# Patient Record
Sex: Female | Born: 1960 | Race: White | Hispanic: No | Marital: Married | Smoking: Never smoker
Health system: Southern US, Community
[De-identification: ages and names within clinical notes are randomized; demographics above are authoritative.]

## PROBLEM LIST (undated history)

## (undated) HISTORY — PX: TUBAL LIGATION: SHX77

---

## 1998-10-14 ENCOUNTER — Emergency Department (HOSPITAL_COMMUNITY): Admission: EM | Admit: 1998-10-14 | Discharge: 1998-10-14 | Payer: Self-pay | Admitting: Emergency Medicine

## 2000-03-28 ENCOUNTER — Other Ambulatory Visit: Admission: RE | Admit: 2000-03-28 | Discharge: 2000-03-28 | Payer: Self-pay | Admitting: Obstetrics and Gynecology

## 2001-04-25 ENCOUNTER — Other Ambulatory Visit: Admission: RE | Admit: 2001-04-25 | Discharge: 2001-04-25 | Payer: Self-pay | Admitting: Obstetrics and Gynecology

## 2002-05-27 ENCOUNTER — Other Ambulatory Visit: Admission: RE | Admit: 2002-05-27 | Discharge: 2002-05-27 | Payer: Self-pay | Admitting: Obstetrics and Gynecology

## 2003-08-04 ENCOUNTER — Other Ambulatory Visit: Admission: RE | Admit: 2003-08-04 | Discharge: 2003-08-04 | Payer: Self-pay | Admitting: Obstetrics and Gynecology

## 2004-08-10 ENCOUNTER — Other Ambulatory Visit: Admission: RE | Admit: 2004-08-10 | Discharge: 2004-08-10 | Payer: Self-pay | Admitting: Obstetrics and Gynecology

## 2005-09-02 ENCOUNTER — Other Ambulatory Visit: Admission: RE | Admit: 2005-09-02 | Discharge: 2005-09-02 | Payer: Self-pay | Admitting: Obstetrics and Gynecology

## 2012-12-18 ENCOUNTER — Other Ambulatory Visit: Payer: Self-pay | Admitting: Obstetrics and Gynecology

## 2012-12-18 DIAGNOSIS — R928 Other abnormal and inconclusive findings on diagnostic imaging of breast: Secondary | ICD-10-CM

## 2012-12-26 ENCOUNTER — Ambulatory Visit
Admission: RE | Admit: 2012-12-26 | Discharge: 2012-12-26 | Disposition: A | Discharge status: Home / Self Care | Payer: BC Managed Care – PPO | Source: Ambulatory Visit | Attending: Obstetrics and Gynecology | Admitting: Obstetrics and Gynecology

## 2012-12-26 DIAGNOSIS — R928 Other abnormal and inconclusive findings on diagnostic imaging of breast: Secondary | ICD-10-CM

## 2012-12-26 IMAGING — US US BREAST*R*
1 series · 4 of 4 positions shown · non-contrast
Comparison: [DATE] [DATE], [DATE], [DATE] [DATE], [DATE]

CLINICAL DATA: Called back from screening mammogram for possible
mass right breast

DIGITAL DIAGNOSTIC RIGHT MAMMOGRAM [DATE] AND RIGHT
BREAST ULTRASOUND:

[Series 1: us breast*right* · 4 of 4 slices shown]
[im 1/4]
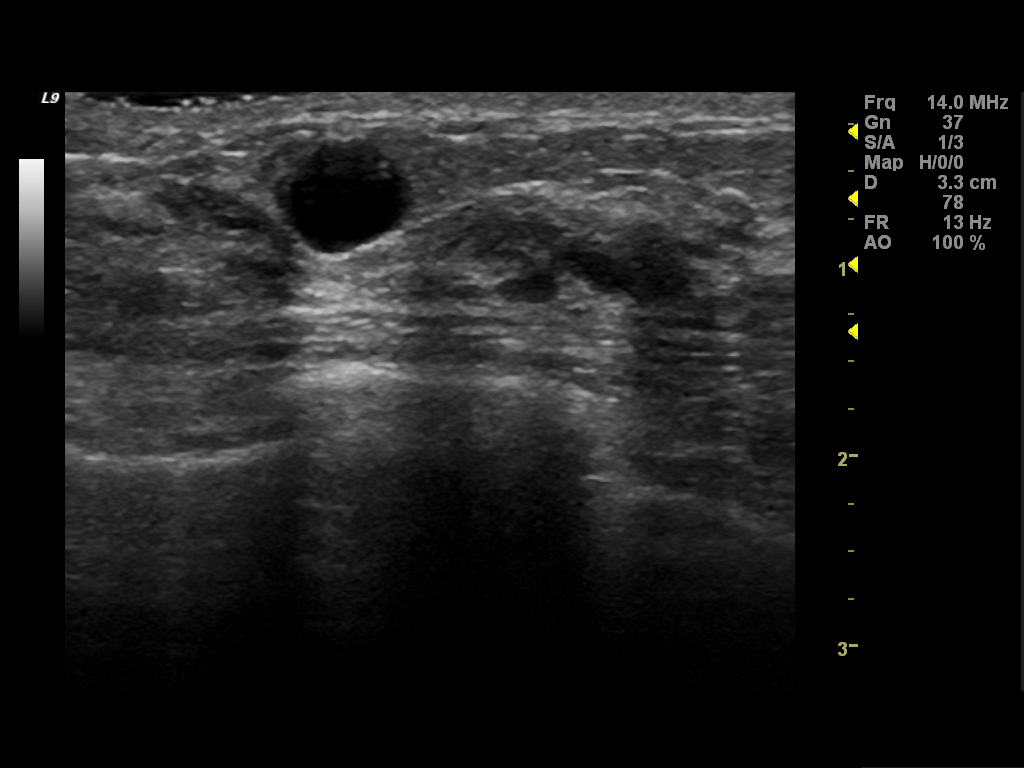
[im 2/4]
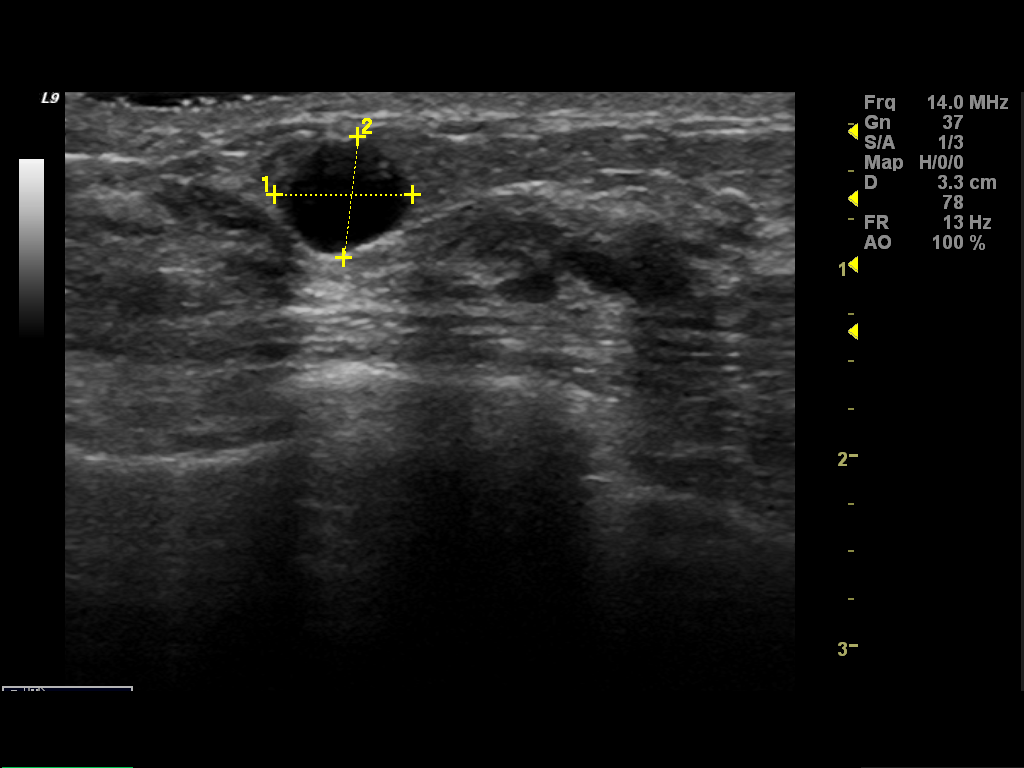
[im 3/4]
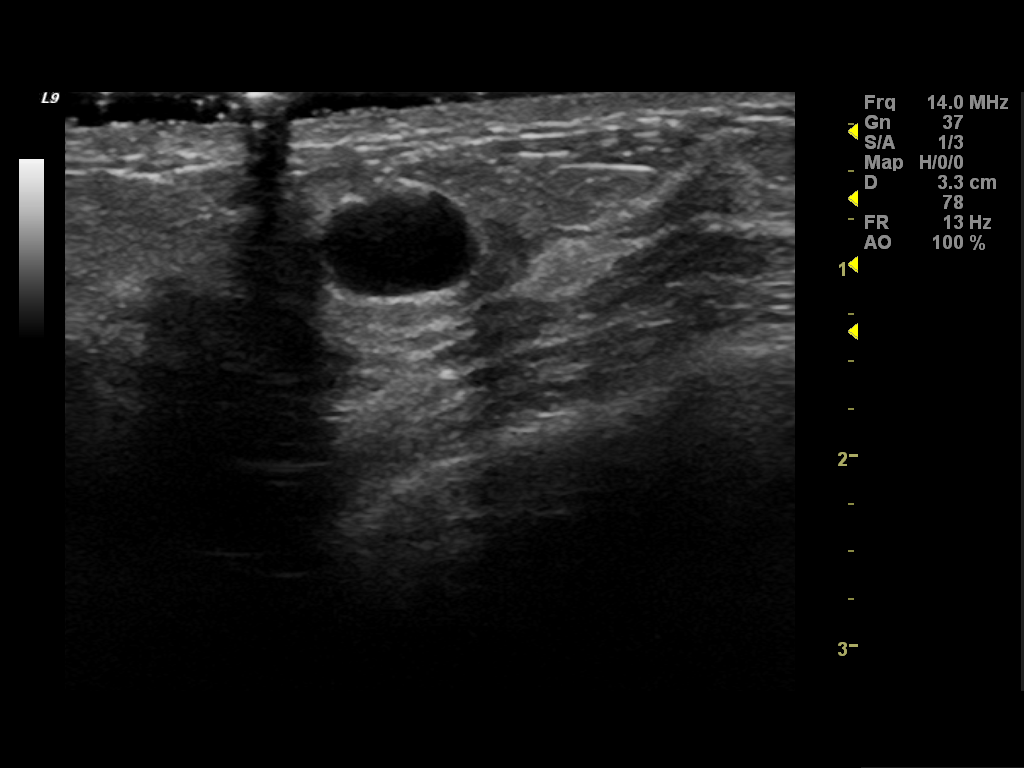
[im 4/4]
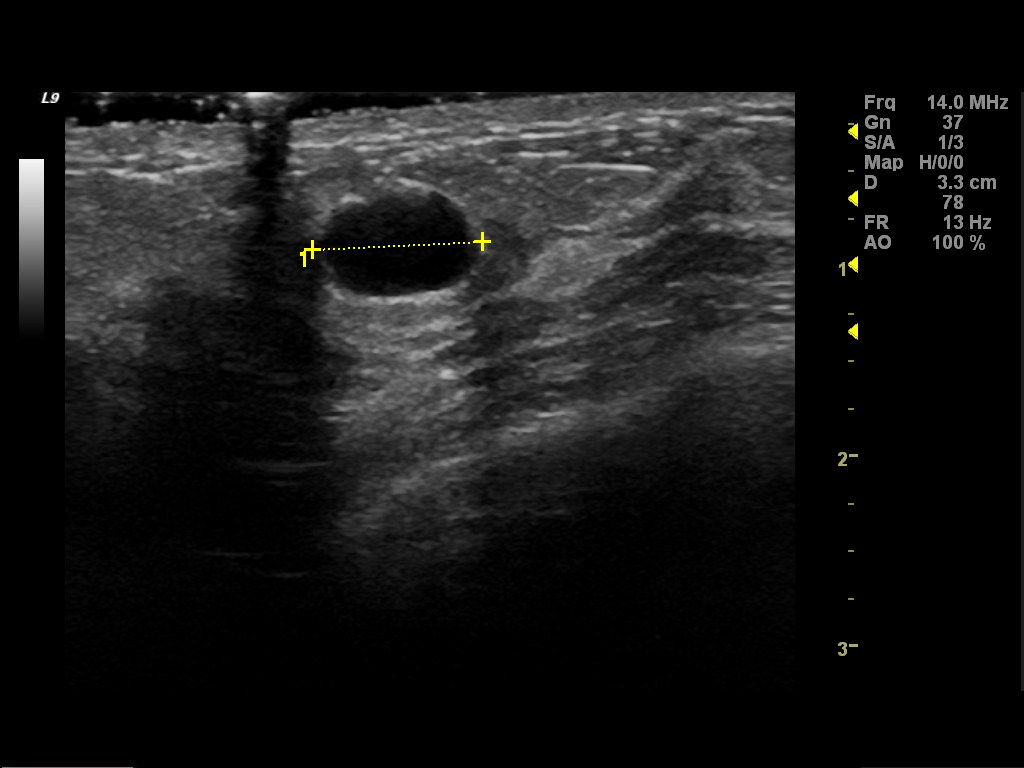

[4 of 4 positions shown; findings below may reference images not displayed]

FINDINGS: ACR Breast Density Category extremely dense

Spot compression CC and MLO views of the right breast are
submitted.  There is a focal asymmetry at the right breast nine
o'clock position.

Ultrasound is performed, showing 0.73 x 0.64 x 0.89 cm simple cyst
at the right breast nine o'clock position 1.5 cm from nipple
correlating to the mammographic finding.
IMPRESSION: Benign findings.

RECOMMENDATION:
Routine screening mammogram back on schedule.

I have discussed the findings and recommendations with the patient.
Results were also provided in writing at the conclusion of the
visit.

BI-RADS CATEGORY 2:  Benign finding(s).

## 2012-12-26 IMAGING — MG MM DIGITAL DIAGNOSTIC UNILAT*R*
2 series · 2 of 2 positions shown · non-contrast
Comparison: [DATE] [DATE], [DATE], [DATE] [DATE], [DATE]

CLINICAL DATA: Called back from screening mammogram for possible
mass right breast

DIGITAL DIAGNOSTIC RIGHT MAMMOGRAM [DATE] AND RIGHT
BREAST ULTRASOUND:

[R CC]
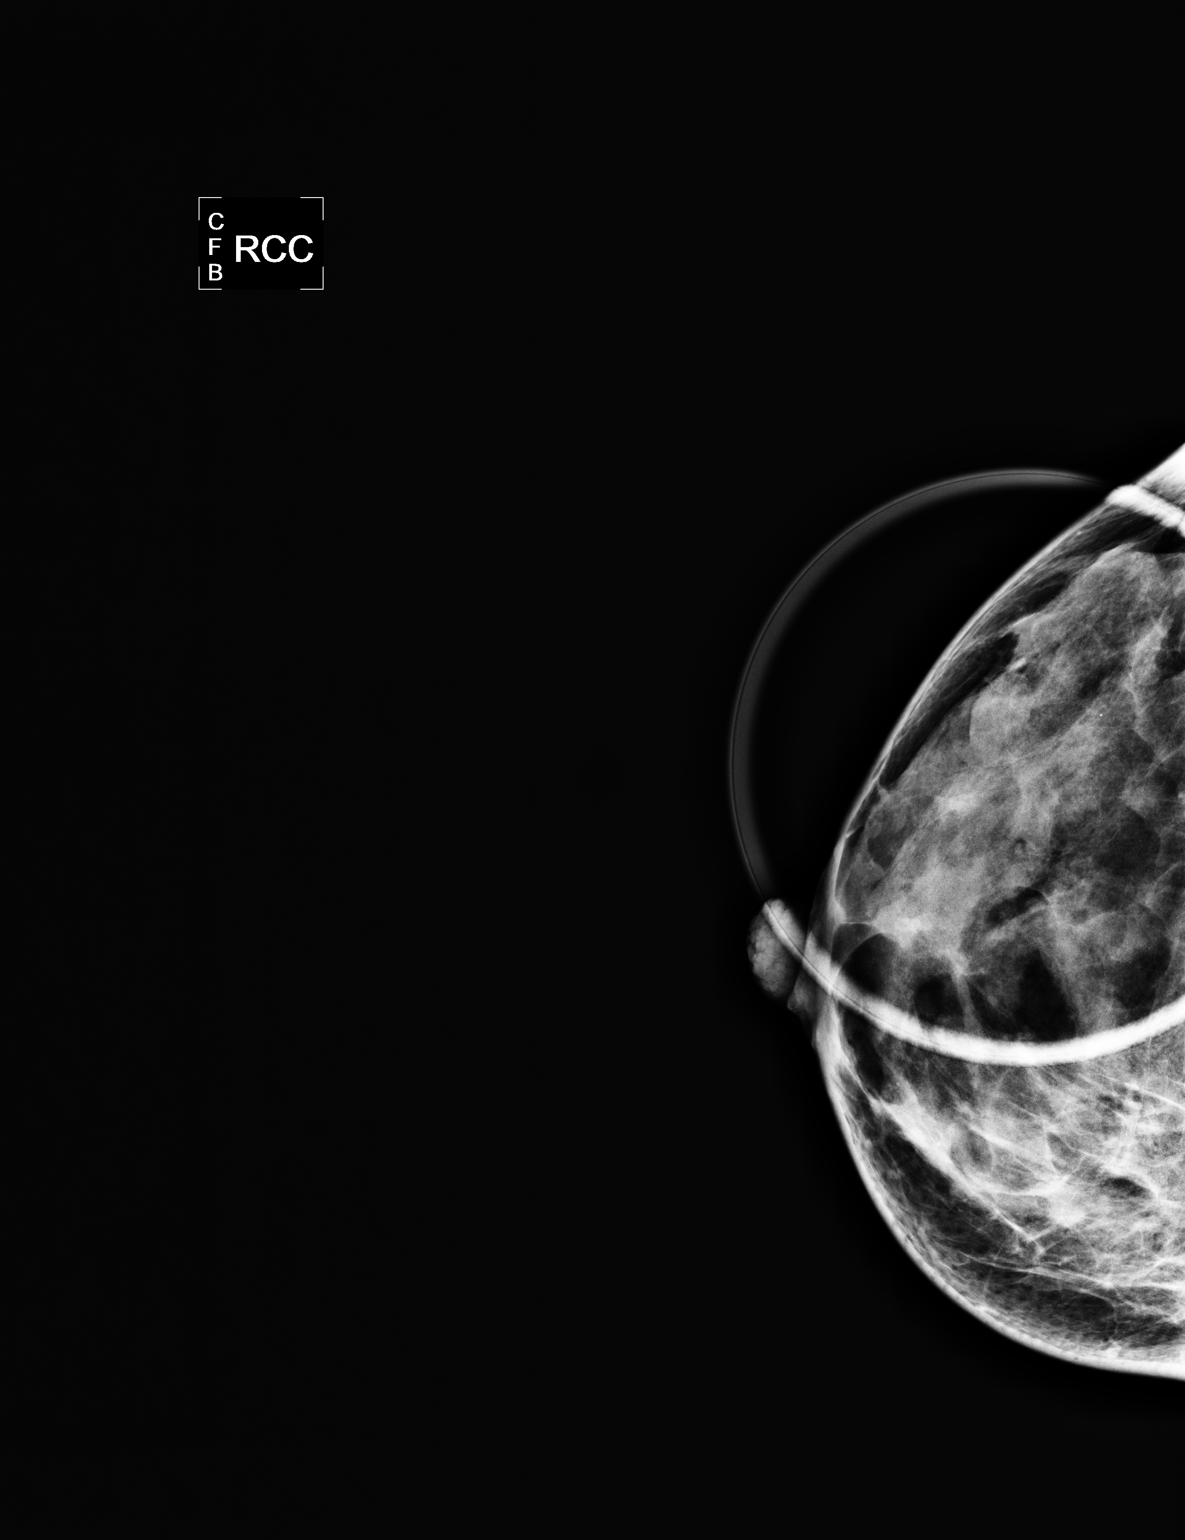

[R MLO]
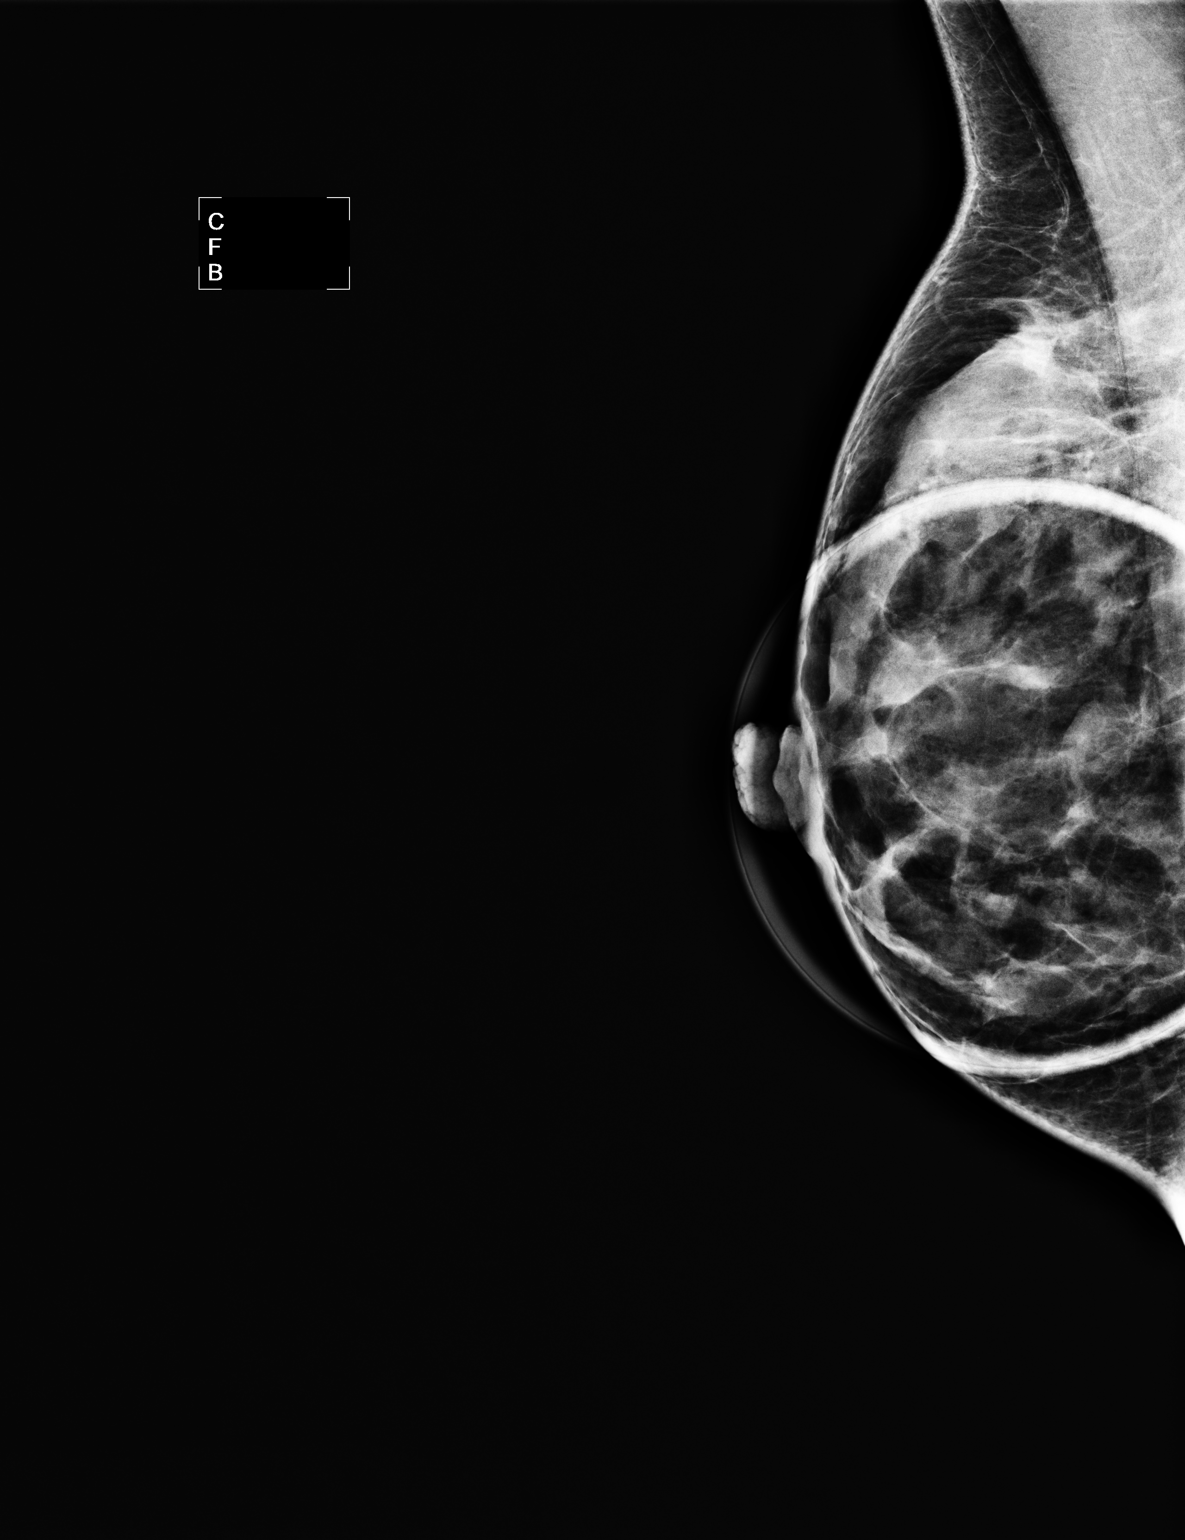

[2 of 2 positions shown; findings below may reference images not displayed]

FINDINGS: ACR Breast Density Category extremely dense

Spot compression CC and MLO views of the right breast are
submitted.  There is a focal asymmetry at the right breast nine
o'clock position.

Ultrasound is performed, showing 0.73 x 0.64 x 0.89 cm simple cyst
at the right breast nine o'clock position 1.5 cm from nipple
correlating to the mammographic finding.
IMPRESSION: Benign findings.

RECOMMENDATION:
Routine screening mammogram back on schedule.

I have discussed the findings and recommendations with the patient.
Results were also provided in writing at the conclusion of the
visit.

BI-RADS CATEGORY 2:  Benign finding(s).

## 2013-12-19 ENCOUNTER — Other Ambulatory Visit: Payer: Self-pay | Admitting: Obstetrics and Gynecology

## 2013-12-19 DIAGNOSIS — R928 Other abnormal and inconclusive findings on diagnostic imaging of breast: Secondary | ICD-10-CM

## 2013-12-24 ENCOUNTER — Ambulatory Visit
Admission: RE | Admit: 2013-12-24 | Discharge: 2013-12-24 | Disposition: A | Discharge status: Home / Self Care | Payer: BC Managed Care – PPO | Source: Ambulatory Visit | Attending: Obstetrics and Gynecology | Admitting: Obstetrics and Gynecology

## 2013-12-24 DIAGNOSIS — R928 Other abnormal and inconclusive findings on diagnostic imaging of breast: Secondary | ICD-10-CM

## 2013-12-24 IMAGING — MG MM DIANOSTIC UNILATERAL R
2 series · 2 of 2 positions shown · non-contrast
Comparison: [DATE], [DATE]

CLINICAL DATA: Patient returns for evaluation of a possible mass in
the left breast noted on recent screening study dated [DATE]
from [REDACTED].

EXAM:
DIGITAL DIAGNOSTIC  RIGHT MAMMOGRAM
ULTRASOUND RIGHT BREAST

[R MLO]
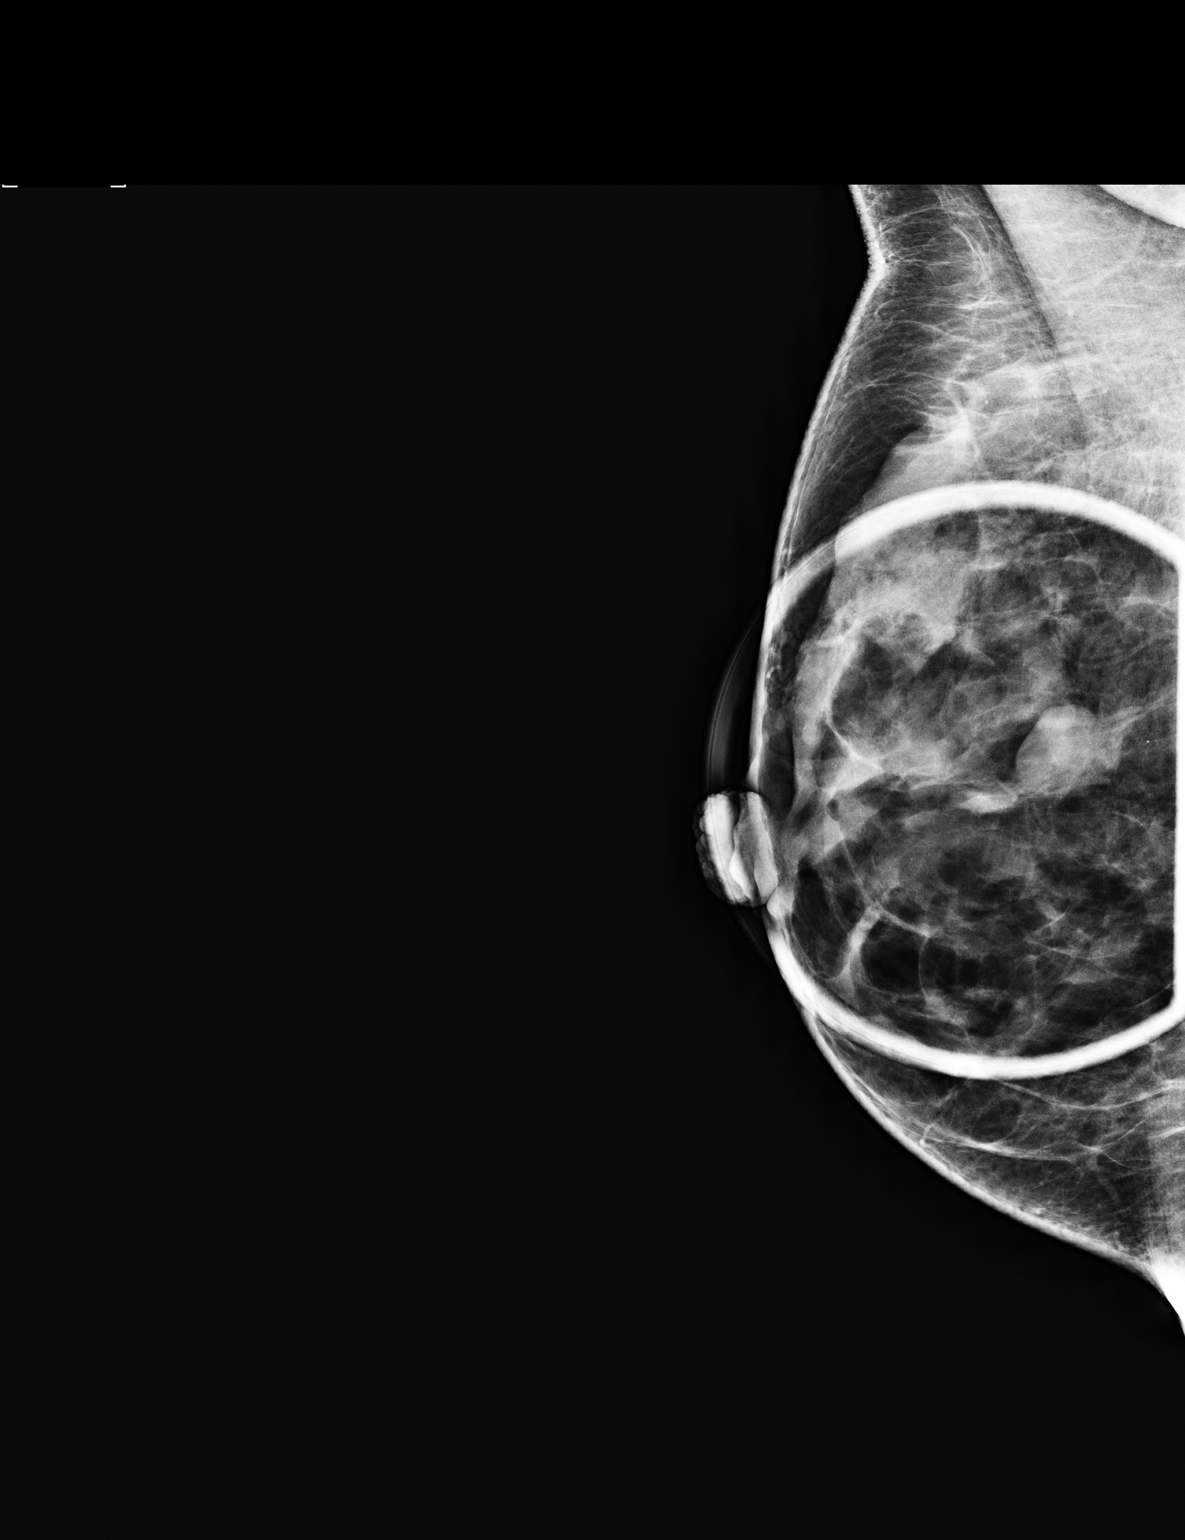

[R CC]
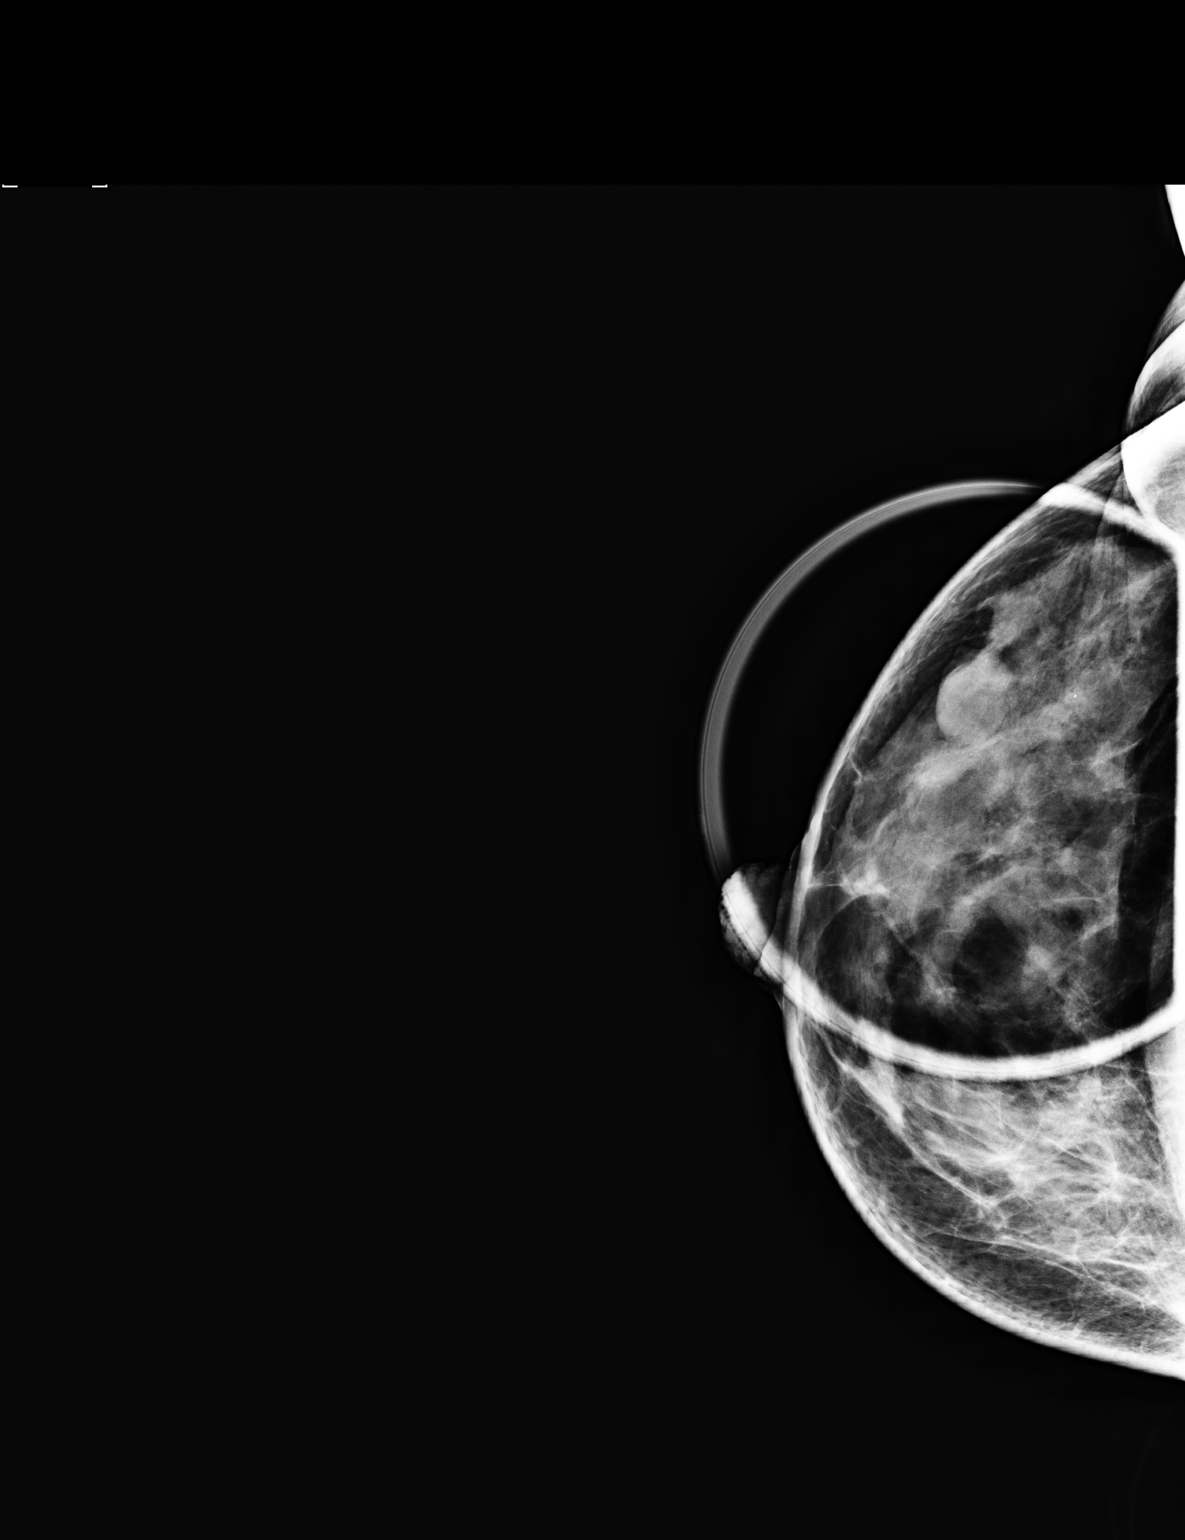

[2 of 2 positions shown; findings below may reference images not displayed]

ACR Breast Density Category c: The breast tissue is heterogeneously
dense, which may obscure small masses.
FINDINGS: Additional views confirm the presence of a predominantly
circumscribed oval mass in the mid outer portion of the right
breast.

On physical exam, no mass is palpated on physical exam

Ultrasound is performed, showing multiple cysts are present in the 9
and 9:30 o'clock position of the right breast. The largest is at 9
o'clock 4 cm from the right nipple, corresponding to the
mammographic nodule, and measuring 1.3 x 0.7 x 1.0 cm. No solid mass
or distortion is noted.
IMPRESSION: Right breast cysts.

RECOMMENDATION:
Yearly screening mammography is suggested.

I have discussed the findings and recommendations with the patient.
Results were also provided in writing at the conclusion of the
visit. If applicable, a reminder letter will be sent to the patient
regarding the next appointment.

BI-RADS CATEGORY  2: Benign Finding(s)

## 2013-12-24 IMAGING — US US BREAST*R* LIMITED INC AXILLA
1 series · 12 of 12 positions shown · non-contrast
Comparison: [DATE], [DATE]

CLINICAL DATA: Patient returns for evaluation of a possible mass in
the left breast noted on recent screening study dated [DATE]
from [REDACTED].

EXAM:
DIGITAL DIAGNOSTIC  RIGHT MAMMOGRAM
ULTRASOUND RIGHT BREAST

[Series 1: us breast*right* limited inc axilla · 12 of 12 slices shown]
[im 1/12]
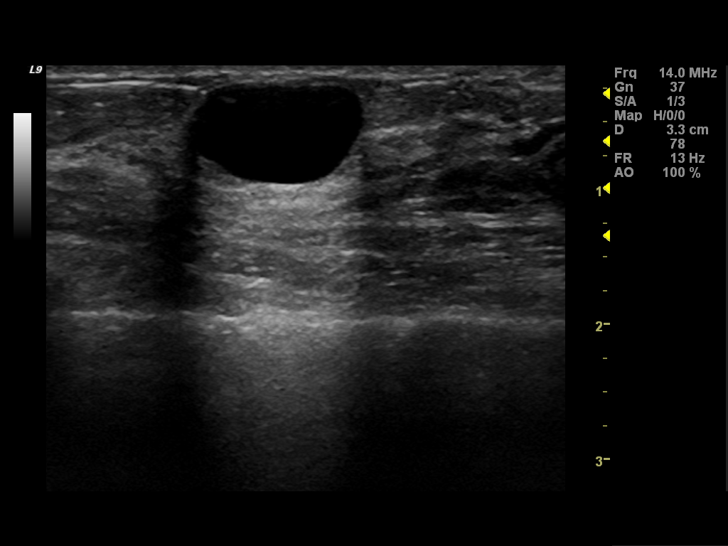
[im 2/12]
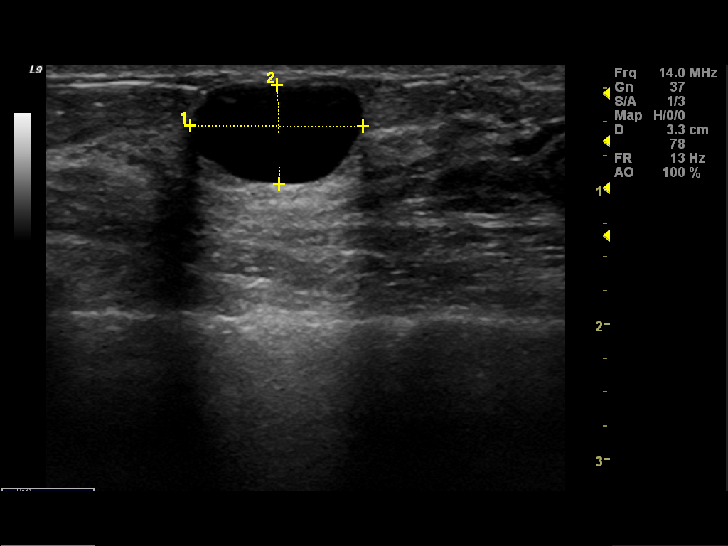
[im 3/12]
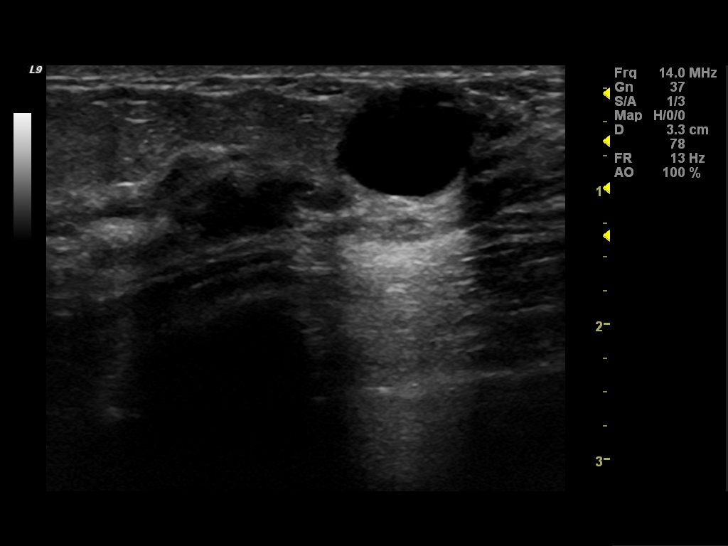
[im 4/12]
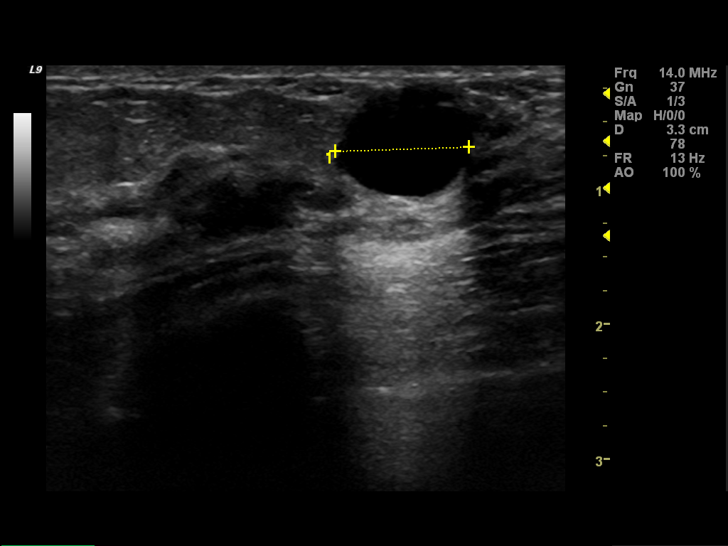
[im 5/12]
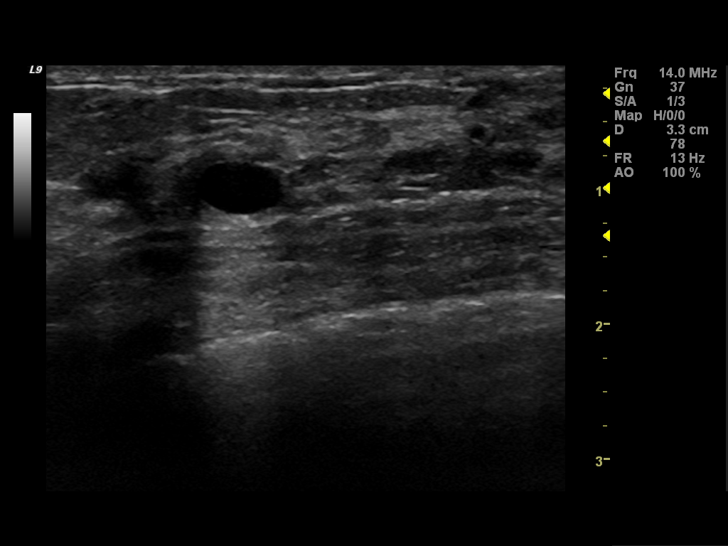
[im 6/12]
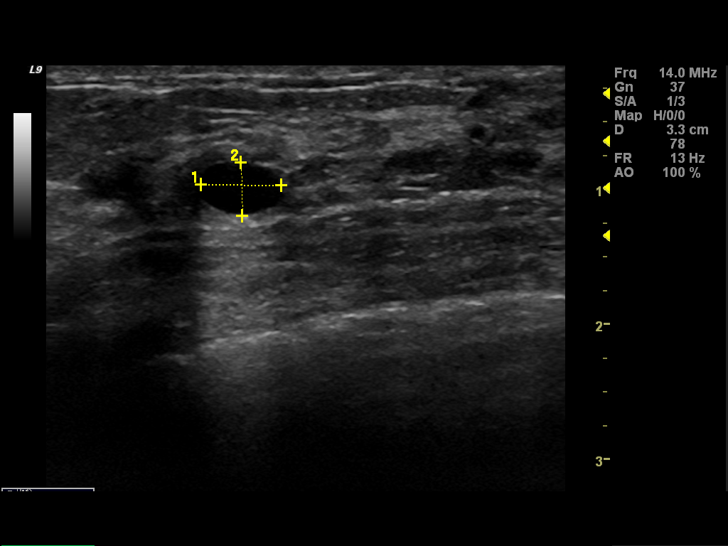
[im 7/12]
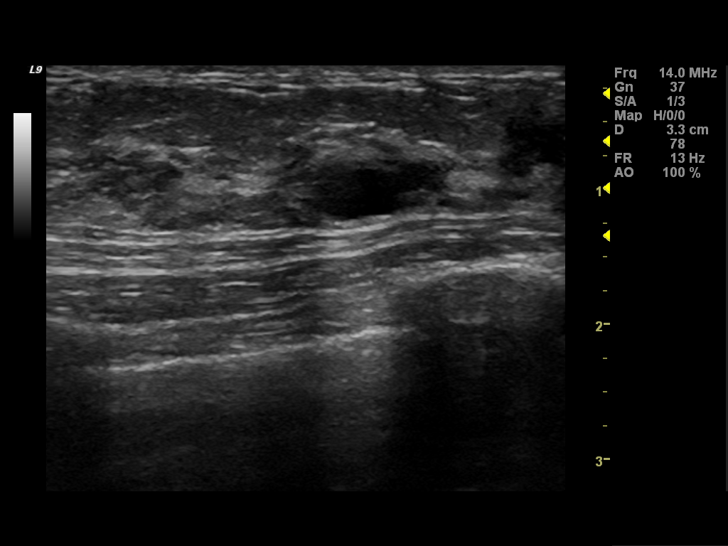
[im 8/12]
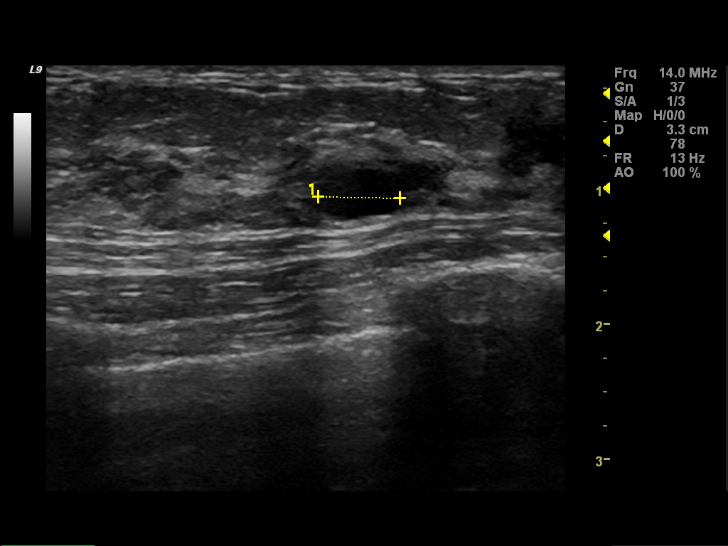
[im 9/12]
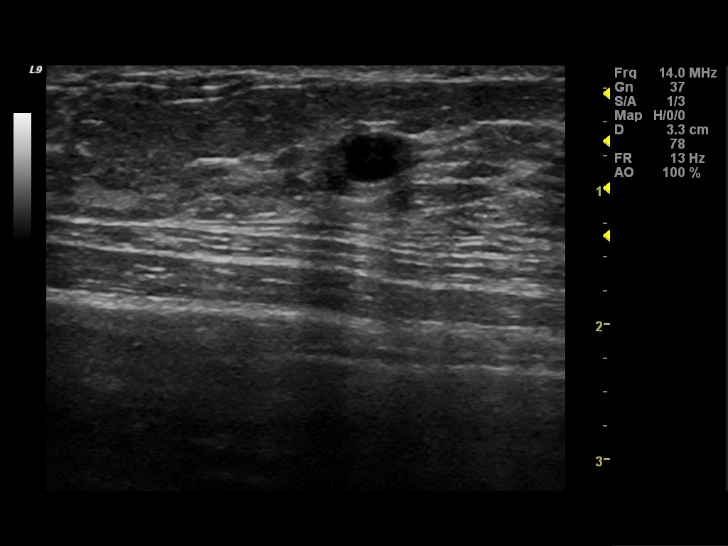
[im 10/12]
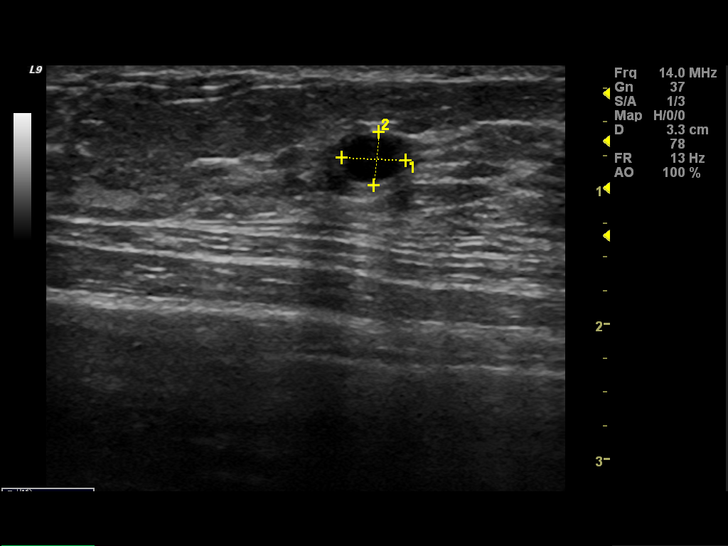
[im 11/12]
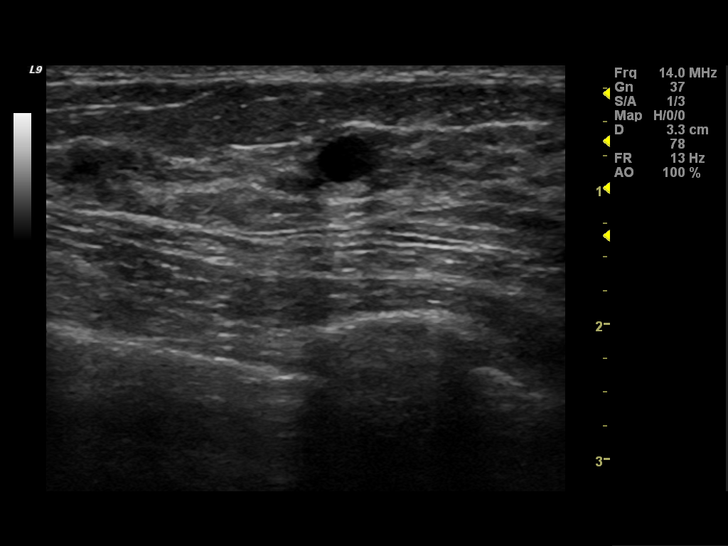
[im 12/12]
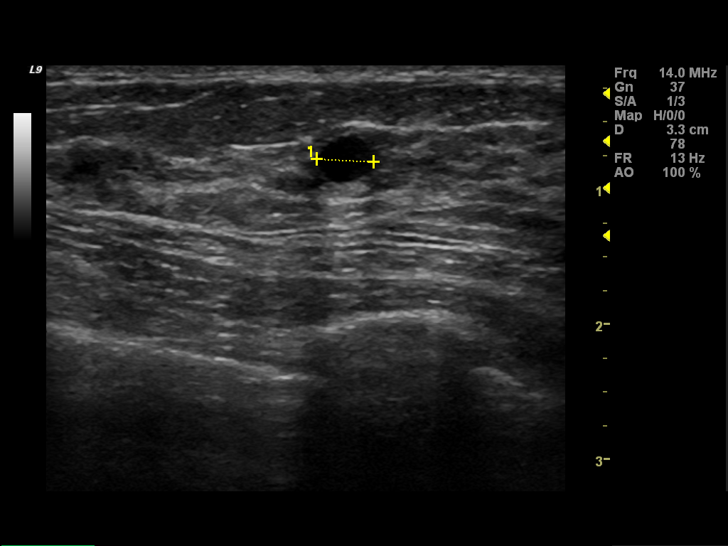

[12 of 12 positions shown; findings below may reference images not displayed]

ACR Breast Density Category c: The breast tissue is heterogeneously
dense, which may obscure small masses.
FINDINGS: Additional views confirm the presence of a predominantly
circumscribed oval mass in the mid outer portion of the right
breast.

On physical exam, no mass is palpated on physical exam

Ultrasound is performed, showing multiple cysts are present in the 9
and 9:30 o'clock position of the right breast. The largest is at 9
o'clock 4 cm from the right nipple, corresponding to the
mammographic nodule, and measuring 1.3 x 0.7 x 1.0 cm. No solid mass
or distortion is noted.
IMPRESSION: Right breast cysts.

RECOMMENDATION:
Yearly screening mammography is suggested.

I have discussed the findings and recommendations with the patient.
Results were also provided in writing at the conclusion of the
visit. If applicable, a reminder letter will be sent to the patient
regarding the next appointment.

BI-RADS CATEGORY  2: Benign Finding(s)

## 2013-12-31 ENCOUNTER — Other Ambulatory Visit: Payer: Self-pay | Admitting: Obstetrics and Gynecology

## 2013-12-31 DIAGNOSIS — N6310 Unspecified lump in the right breast, unspecified quadrant: Secondary | ICD-10-CM

## 2017-04-10 DIAGNOSIS — Z01419 Encounter for gynecological examination (general) (routine) without abnormal findings: Secondary | ICD-10-CM | POA: Diagnosis not present

## 2017-04-10 DIAGNOSIS — Z1231 Encounter for screening mammogram for malignant neoplasm of breast: Secondary | ICD-10-CM | POA: Diagnosis not present

## 2017-04-10 DIAGNOSIS — Z6823 Body mass index (BMI) 23.0-23.9, adult: Secondary | ICD-10-CM | POA: Diagnosis not present

## 2017-05-16 DIAGNOSIS — L573 Poikiloderma of Civatte: Secondary | ICD-10-CM | POA: Diagnosis not present

## 2017-05-16 DIAGNOSIS — L738 Other specified follicular disorders: Secondary | ICD-10-CM | POA: Diagnosis not present

## 2017-05-16 DIAGNOSIS — L720 Epidermal cyst: Secondary | ICD-10-CM | POA: Diagnosis not present

## 2017-07-24 DIAGNOSIS — M859 Disorder of bone density and structure, unspecified: Secondary | ICD-10-CM | POA: Diagnosis not present

## 2017-07-24 DIAGNOSIS — Z0000 Encounter for general adult medical examination without abnormal findings: Secondary | ICD-10-CM | POA: Diagnosis not present

## 2017-07-24 DIAGNOSIS — E784 Other hyperlipidemia: Secondary | ICD-10-CM | POA: Diagnosis not present

## 2017-07-31 DIAGNOSIS — E559 Vitamin D deficiency, unspecified: Secondary | ICD-10-CM | POA: Diagnosis not present

## 2017-07-31 DIAGNOSIS — Z0000 Encounter for general adult medical examination without abnormal findings: Secondary | ICD-10-CM | POA: Diagnosis not present

## 2017-07-31 DIAGNOSIS — E784 Other hyperlipidemia: Secondary | ICD-10-CM | POA: Diagnosis not present

## 2017-07-31 DIAGNOSIS — M25561 Pain in right knee: Secondary | ICD-10-CM | POA: Diagnosis not present

## 2017-07-31 DIAGNOSIS — Z1389 Encounter for screening for other disorder: Secondary | ICD-10-CM | POA: Diagnosis not present

## 2017-09-27 DIAGNOSIS — H04123 Dry eye syndrome of bilateral lacrimal glands: Secondary | ICD-10-CM | POA: Diagnosis not present

## 2017-10-12 DIAGNOSIS — Z23 Encounter for immunization: Secondary | ICD-10-CM | POA: Diagnosis not present

## 2018-04-19 DIAGNOSIS — Z13820 Encounter for screening for osteoporosis: Secondary | ICD-10-CM | POA: Diagnosis not present

## 2018-04-19 DIAGNOSIS — Z1231 Encounter for screening mammogram for malignant neoplasm of breast: Secondary | ICD-10-CM | POA: Diagnosis not present

## 2018-04-19 DIAGNOSIS — Z6822 Body mass index (BMI) 22.0-22.9, adult: Secondary | ICD-10-CM | POA: Diagnosis not present

## 2018-04-19 DIAGNOSIS — Z01419 Encounter for gynecological examination (general) (routine) without abnormal findings: Secondary | ICD-10-CM | POA: Diagnosis not present

## 2018-05-28 DIAGNOSIS — R1084 Generalized abdominal pain: Secondary | ICD-10-CM | POA: Diagnosis not present

## 2018-05-28 DIAGNOSIS — R1031 Right lower quadrant pain: Secondary | ICD-10-CM | POA: Diagnosis not present

## 2018-05-28 DIAGNOSIS — M545 Low back pain: Secondary | ICD-10-CM | POA: Diagnosis not present

## 2018-05-30 DIAGNOSIS — M546 Pain in thoracic spine: Secondary | ICD-10-CM | POA: Diagnosis not present

## 2018-06-04 DIAGNOSIS — S30861A Insect bite (nonvenomous) of abdominal wall, initial encounter: Secondary | ICD-10-CM | POA: Diagnosis not present

## 2018-06-04 DIAGNOSIS — M545 Low back pain: Secondary | ICD-10-CM | POA: Diagnosis not present

## 2018-06-04 DIAGNOSIS — Z6821 Body mass index (BMI) 21.0-21.9, adult: Secondary | ICD-10-CM | POA: Diagnosis not present

## 2018-06-27 DIAGNOSIS — L821 Other seborrheic keratosis: Secondary | ICD-10-CM | POA: Diagnosis not present

## 2018-06-27 DIAGNOSIS — D2262 Melanocytic nevi of left upper limb, including shoulder: Secondary | ICD-10-CM | POA: Diagnosis not present

## 2018-06-27 DIAGNOSIS — L738 Other specified follicular disorders: Secondary | ICD-10-CM | POA: Diagnosis not present

## 2018-07-31 DIAGNOSIS — R82998 Other abnormal findings in urine: Secondary | ICD-10-CM | POA: Diagnosis not present

## 2018-07-31 DIAGNOSIS — E559 Vitamin D deficiency, unspecified: Secondary | ICD-10-CM | POA: Diagnosis not present

## 2018-07-31 DIAGNOSIS — Z0000 Encounter for general adult medical examination without abnormal findings: Secondary | ICD-10-CM | POA: Diagnosis not present

## 2018-07-31 DIAGNOSIS — E7849 Other hyperlipidemia: Secondary | ICD-10-CM | POA: Diagnosis not present

## 2018-08-03 DIAGNOSIS — Z1212 Encounter for screening for malignant neoplasm of rectum: Secondary | ICD-10-CM | POA: Diagnosis not present

## 2018-08-07 DIAGNOSIS — G4709 Other insomnia: Secondary | ICD-10-CM | POA: Diagnosis not present

## 2018-08-07 DIAGNOSIS — Z23 Encounter for immunization: Secondary | ICD-10-CM | POA: Diagnosis not present

## 2018-08-07 DIAGNOSIS — Z1389 Encounter for screening for other disorder: Secondary | ICD-10-CM | POA: Diagnosis not present

## 2018-08-07 DIAGNOSIS — R6884 Jaw pain: Secondary | ICD-10-CM | POA: Diagnosis not present

## 2018-08-07 DIAGNOSIS — Z0000 Encounter for general adult medical examination without abnormal findings: Secondary | ICD-10-CM | POA: Diagnosis not present

## 2018-08-07 DIAGNOSIS — M545 Low back pain: Secondary | ICD-10-CM | POA: Diagnosis not present

## 2018-10-16 DIAGNOSIS — H00019 Hordeolum externum unspecified eye, unspecified eyelid: Secondary | ICD-10-CM | POA: Diagnosis not present

## 2019-08-15 ENCOUNTER — Other Ambulatory Visit: Payer: Self-pay | Admitting: Internal Medicine

## 2019-08-15 DIAGNOSIS — E785 Hyperlipidemia, unspecified: Secondary | ICD-10-CM

## 2019-09-06 ENCOUNTER — Ambulatory Visit
Admission: RE | Admit: 2019-09-06 | Discharge: 2019-09-06 | Disposition: A | Discharge status: Home / Self Care | Payer: No Typology Code available for payment source | Source: Ambulatory Visit | Attending: Internal Medicine | Admitting: Internal Medicine

## 2019-09-06 DIAGNOSIS — E785 Hyperlipidemia, unspecified: Secondary | ICD-10-CM

## 2019-09-06 IMAGING — CT CT HEART SCORING
3 series · 12 of 20 positions shown, 14 images · non-contrast
Comparison: None.

CLINICAL DATA: 57-year-old white female with hyperlipidemia.

EXAM:
CT HEART FOR CALCIUM SCORING
TECHNIQUE: CT heart was performed using prospective ECG gating.
A non-contrast exam for calcium scoring was performed.
Note that this exam targets the heart and the chest was not imaged
in its entirety.

[Series 2: calcium scoring 2.00 qr36 bestdiast 68% · axial · 0.32mm/px · z∈[+1685,+1713]mm · 2 of 70 slices shown]
[im 14/70  vessel]
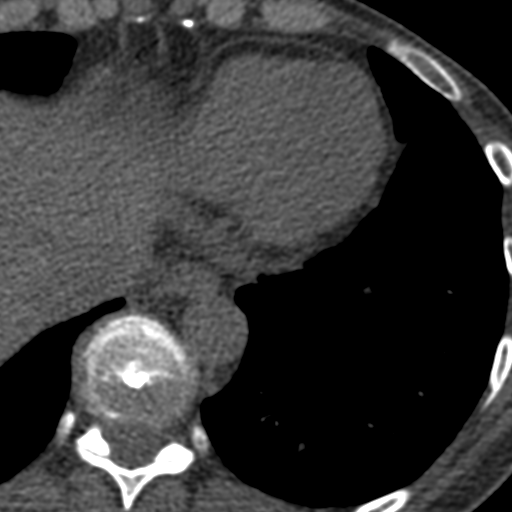
[im 28/70  vessel]
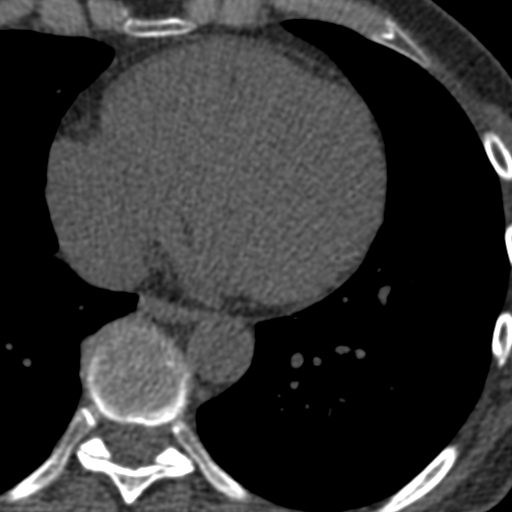

[Series 3: calcium scoring 2.00 br40 bestdiast 68% fov · axial · 0.40mm/px · z∈[+1681,+1773]mm · 5 of 70 slices shown, 7 images]
[im 12/70  vessel]
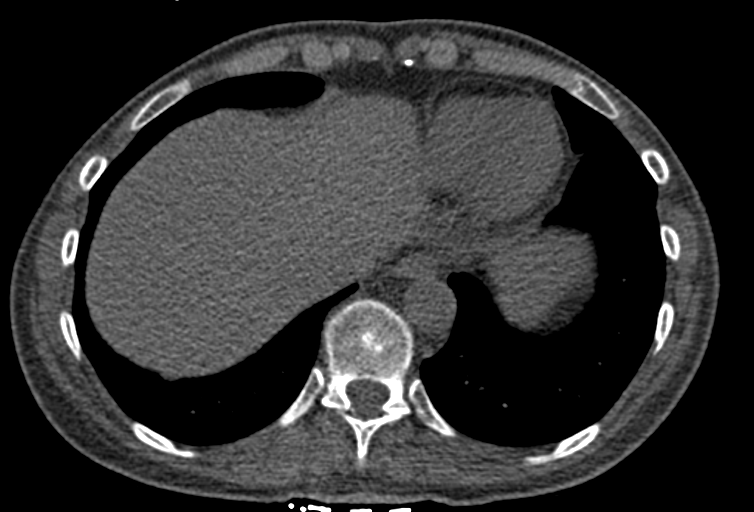
[im 12/70  lung]
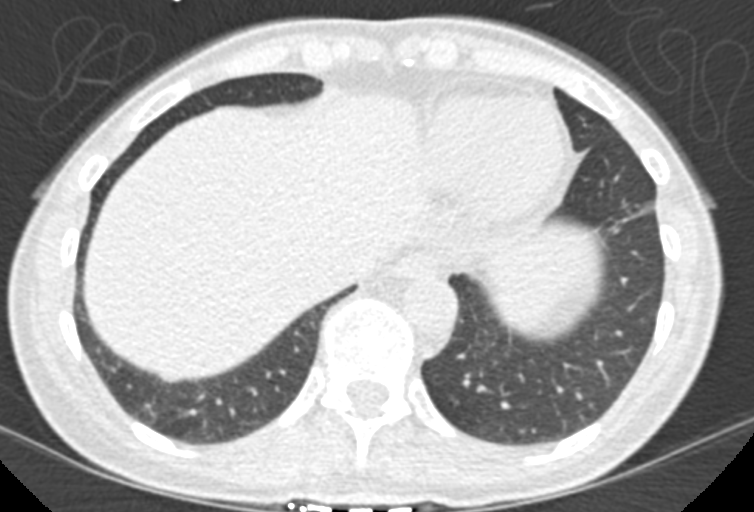
[im 24/70  vessel]
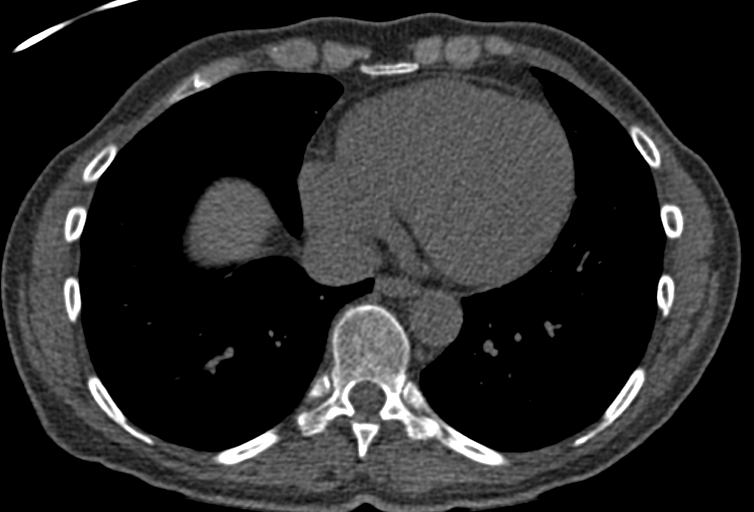
[im 35/70  vessel]
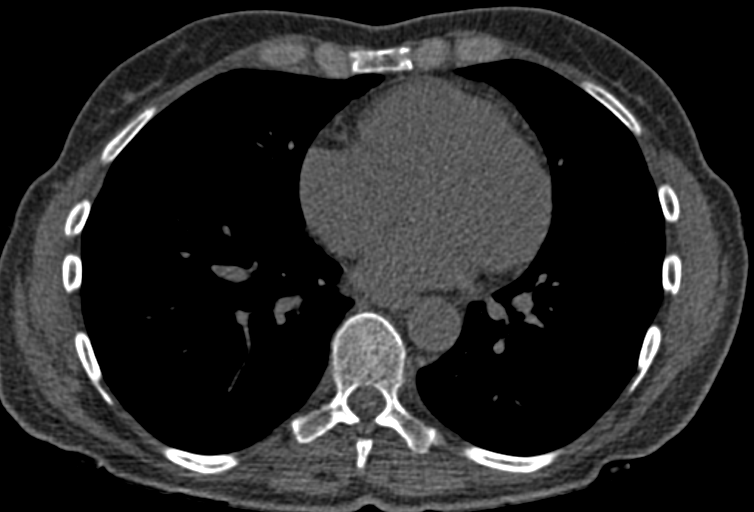
[im 47/70  vessel]
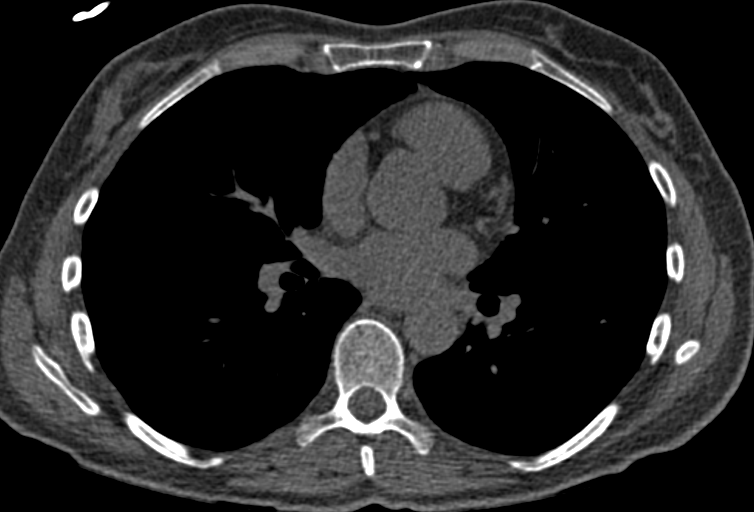
[im 58/70  vessel]
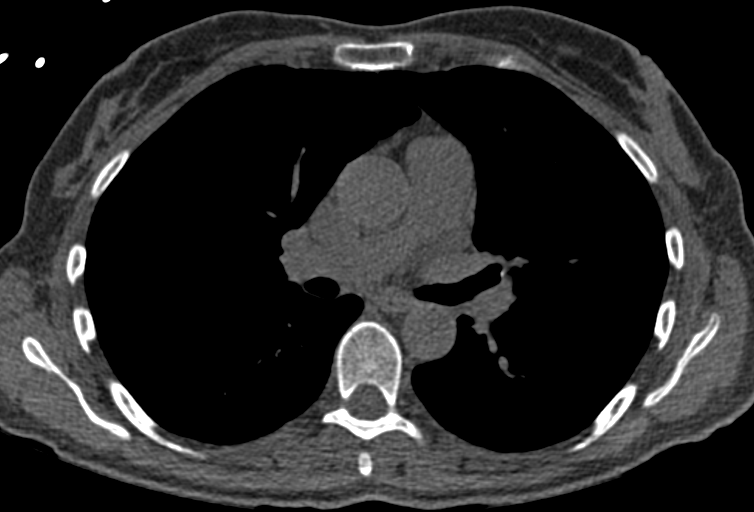
[im 58/70  lung]
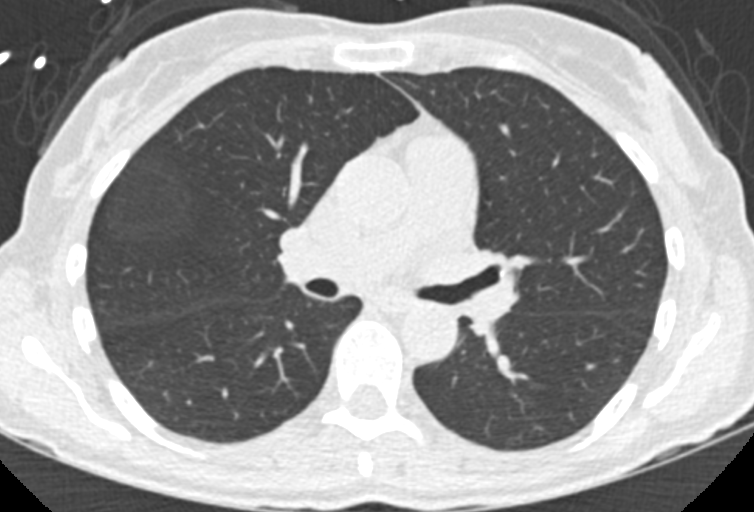

[Series 9: calcium scoring 2.00 br60 bestdiast 68% fov · axial · 0.40mm/px · z∈[+1681,+1773]mm · 5 of 70 slices shown]
[im 12/70  vessel]
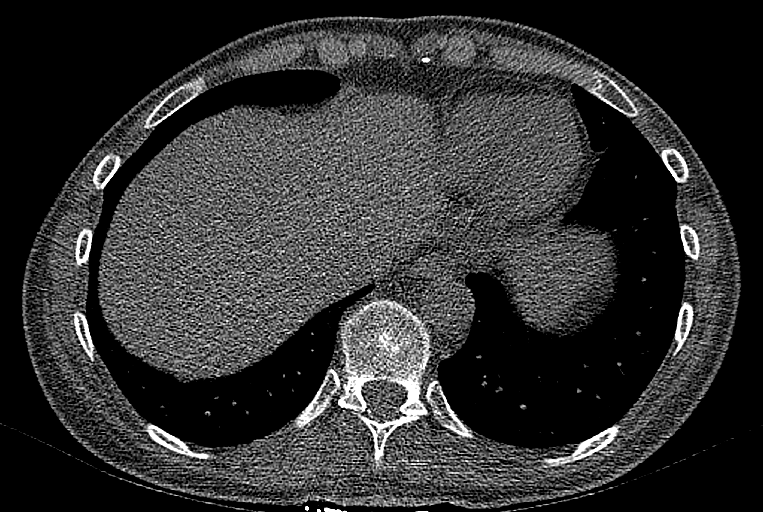
[im 24/70  vessel]
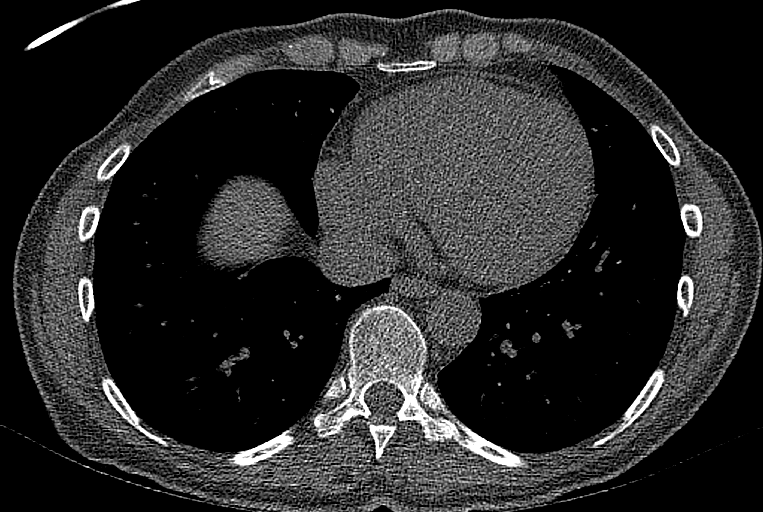
[im 35/70  vessel]
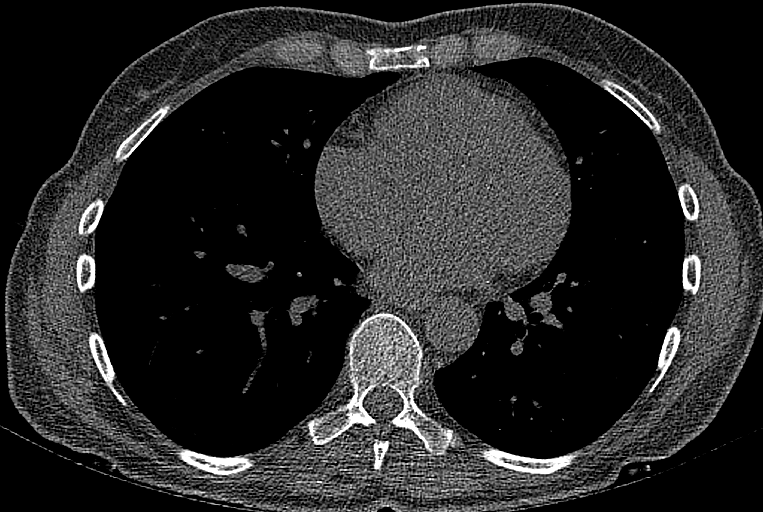
[im 47/70  vessel]
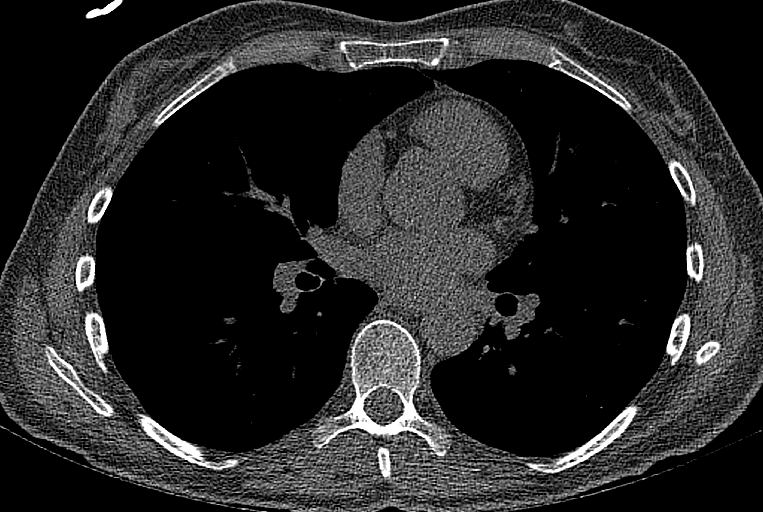
[im 58/70  vessel]
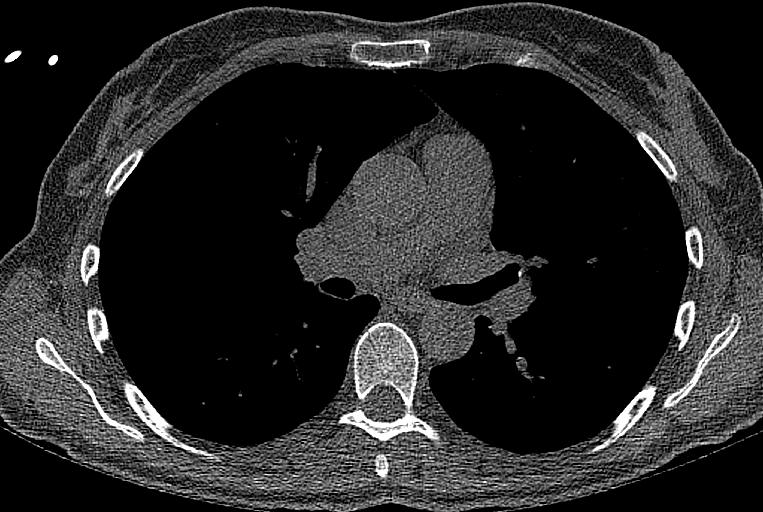

[12 of 20 positions shown; findings below may reference images not displayed]

FINDINGS: Technical quality: Good.

CORONARY CALCIUM

Total Agatston Score: 0

[HOSPITAL] percentile:  0

OTHER FINDINGS:

Cardiovascular: No coronary artery calcium. Normal caliber of the
visualized thoracic aorta without atherosclerotic calcifications.
Minimal pericardial fluid. Heart size is normal.

Mediastinum/Nodes: Visualized mediastinal and hilar structures are
unremarkable.

Lungs/Pleura: Several small pleural-based nodular densities. Largest
pleural-based density is a broad-based nodule along the right
hemidiaphragm measuring 7 x 3 mm, mean diameter is 5 mm. Central
nodule in the right middle lobe on sequence 9, image 19 measures
roughly 4 mm. No large pleural effusions. No significant airspace
disease or lung consolidation.

Upper Abdomen: Images upper abdomen are unremarkable.

Musculoskeletal: No acute bone abnormality.
IMPRESSION: 1. Coronary calcium score is 0. No evidence for coronary artery
calcium.
2. Several small pulmonary nodules. Largest pleural-based nodule is
along the right lung base and has a mean diameter of 5 mm. 4 mm
nodule in the right middle lobe. No follow-up needed if patient is
low-risk (and has no known or suspected primary neoplasm).
Non-contrast chest CT can be considered in 12 months if patient is
high-risk. This recommendation follows the consensus statement:
Guidelines for Management of Incidental Pulmonary Nodules Detected

## 2019-10-02 ENCOUNTER — Other Ambulatory Visit: Payer: Self-pay

## 2019-10-02 DIAGNOSIS — Z20822 Contact with and (suspected) exposure to covid-19: Secondary | ICD-10-CM

## 2019-10-03 LAB — NOVEL CORONAVIRUS, NAA: SARS-CoV-2, NAA: NOT DETECTED

## 2019-10-31 ENCOUNTER — Other Ambulatory Visit: Payer: Self-pay

## 2019-10-31 DIAGNOSIS — Z20822 Contact with and (suspected) exposure to covid-19: Secondary | ICD-10-CM

## 2019-11-03 LAB — NOVEL CORONAVIRUS, NAA: SARS-CoV-2, NAA: NOT DETECTED

## 2020-11-04 ENCOUNTER — Other Ambulatory Visit: Payer: Self-pay | Admitting: Radiology

## 2020-11-04 DIAGNOSIS — Z803 Family history of malignant neoplasm of breast: Secondary | ICD-10-CM

## 2020-11-07 ENCOUNTER — Ambulatory Visit
Admission: RE | Admit: 2020-11-07 | Discharge: 2020-11-07 | Disposition: A | Discharge status: Home / Self Care | Payer: Managed Care, Other (non HMO) | Source: Ambulatory Visit | Attending: Radiology | Admitting: Radiology

## 2020-11-07 ENCOUNTER — Other Ambulatory Visit: Payer: Self-pay

## 2020-11-07 DIAGNOSIS — Z803 Family history of malignant neoplasm of breast: Secondary | ICD-10-CM

## 2020-11-07 IMAGING — MR MR BREAST WO/W CM  BILAT
5 series · 30 of 48 positions shown · IV contrast (gadavist)
Comparison: No prior MRI. Multiple prior mammograms, most recently
screening mammography [DATE].

CLINICAL DATA: Abbreviated Breast MRI for breast cancer screening.
Family history of breast cancer in her mother at age 63 and in a
maternal aunt. Patient is currently undergoing estrogen therapy.

LABS:  Not applicable.
EXAM:
BILATERAL ABBREVIATED BREAST MRI WITH AND WITHOUT CONTRAST
TECHNIQUE: Multiplanar, multisequence MR images of both breasts were obtained
prior to and following the intravenous administration of 7 ml of
Gadavist.

[Series 2: t2_tirm_tra ipat (a-p) · axial · 3.0mm · 0.68mm/px · z∈[-81,+81]mm · 5 of 54 slices shown]
[im 1/54]
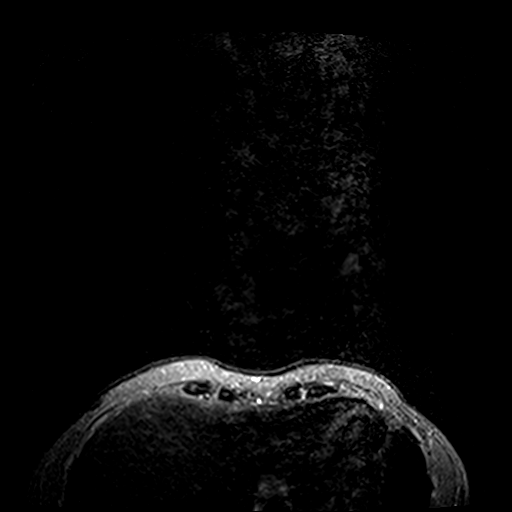
[im 14/54]
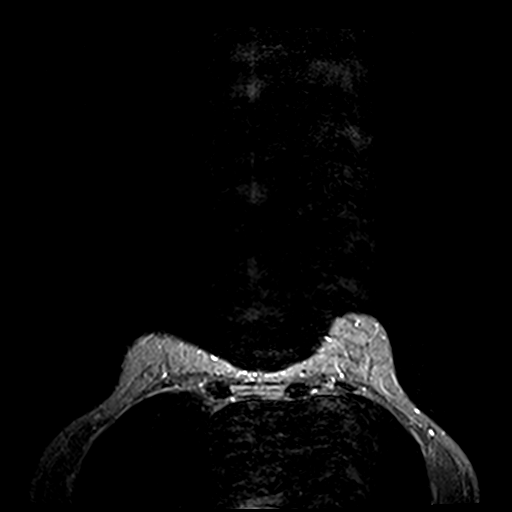
[im 27/54]
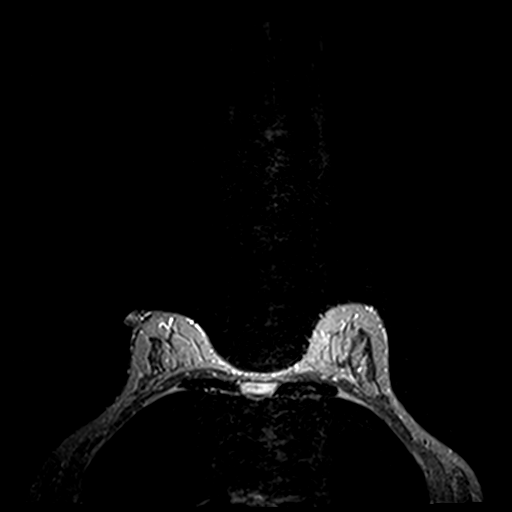
[im 40/54]
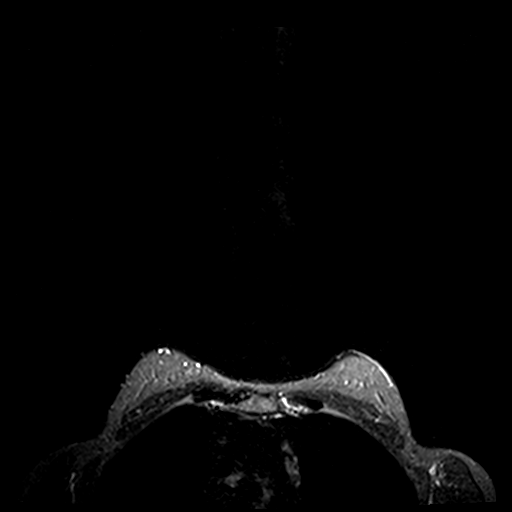
[im 54/54]
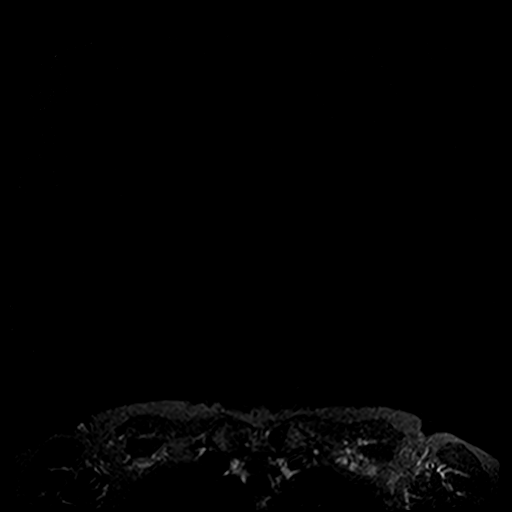

[Series 3: fl3d pre-cm · axial · non-contrast · 1.2mm · 0.91mm/px · z∈[-86,+85]mm · 8 of 144 slices shown]
[im 1/144]
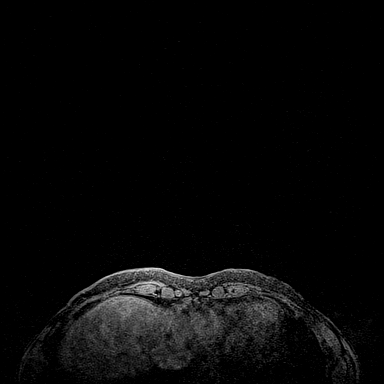
[im 23/144]
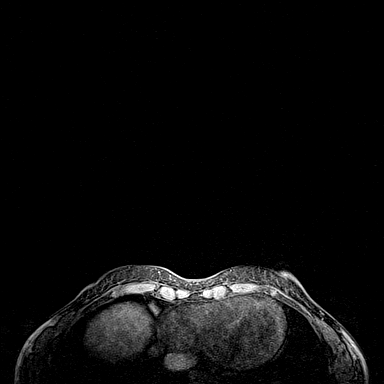
[im 45/144]
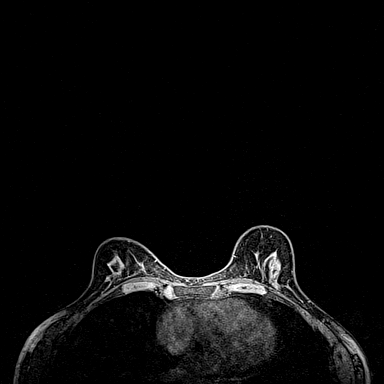
[im 67/144]
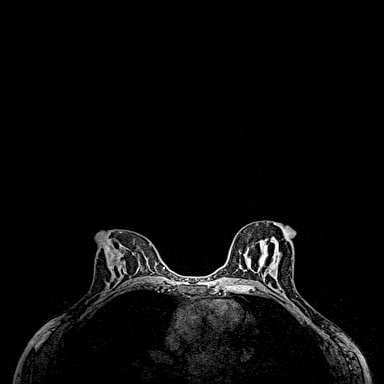
[im 78/144]
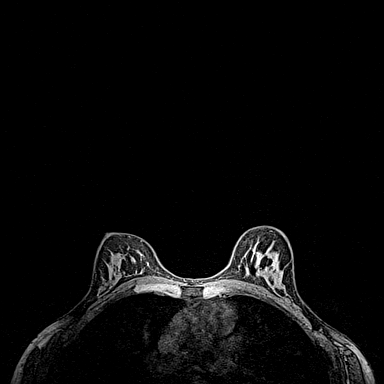
[im 100/144]
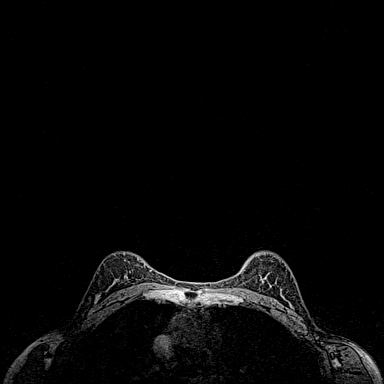
[im 122/144]
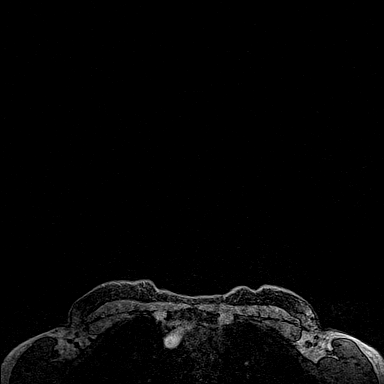
[im 144/144]
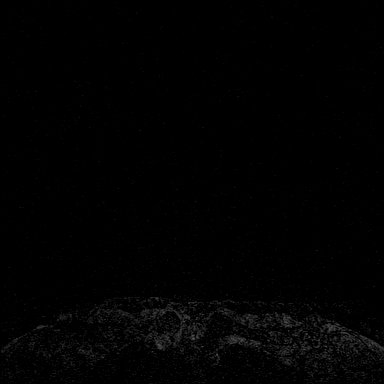

[Series 4: fl3d post-cm 20 · axial · 1.2mm · 0.91mm/px · z∈[-86,+85]mm · 8 of 144 slices shown (1 of 3)]
[im 1/144]
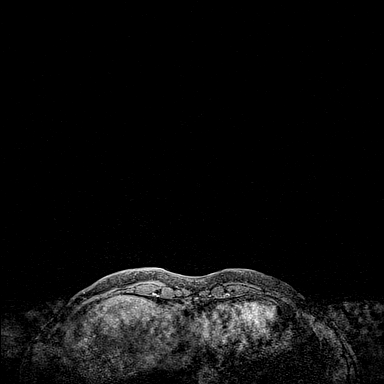
[im 23/144]
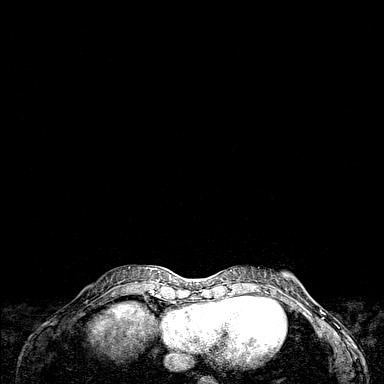
[im 45/144]
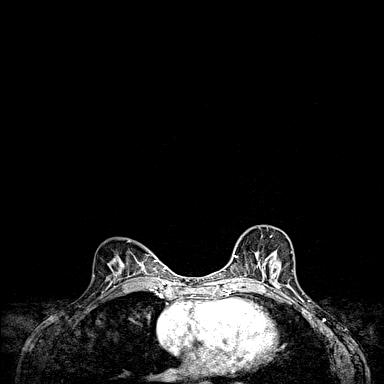
[im 67/144]
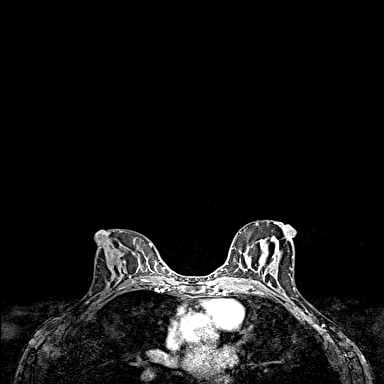
[im 78/144]
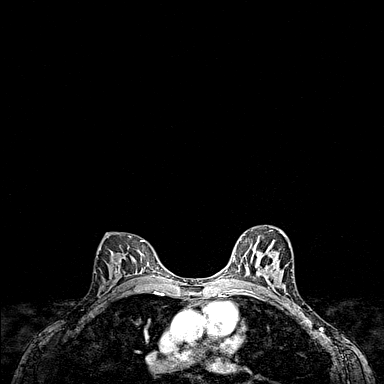
[im 100/144]
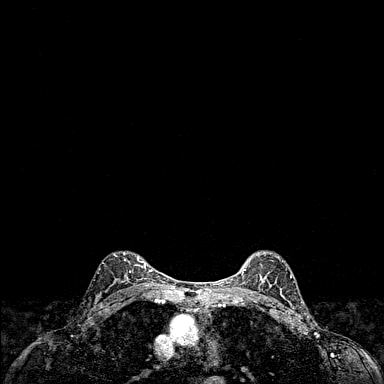
[im 122/144]
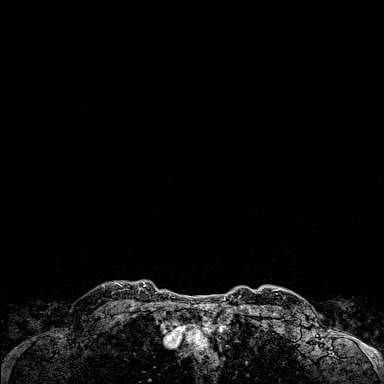
[im 144/144]
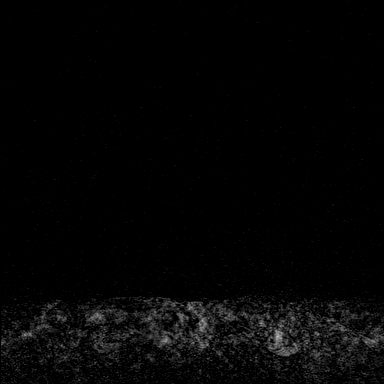

[Series 5: fl3d post-cm 20 · axial · 1.2mm · 0.91mm/px · z∈[-86,+85]mm · 8 of 144 slices shown (2 of 3)]
[im 1/144]
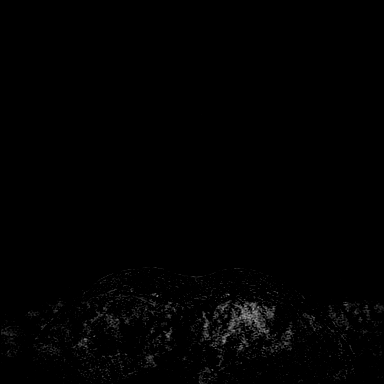
[im 23/144]
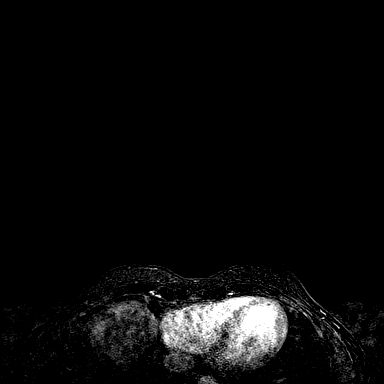
[im 45/144]
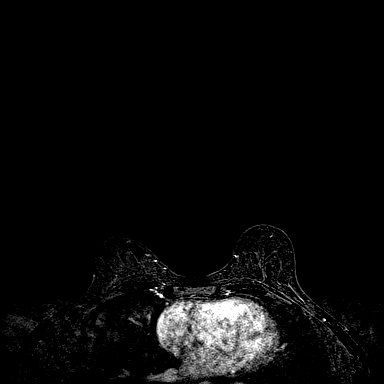
[im 67/144]
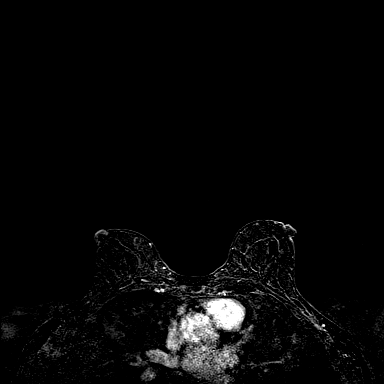
[im 78/144]
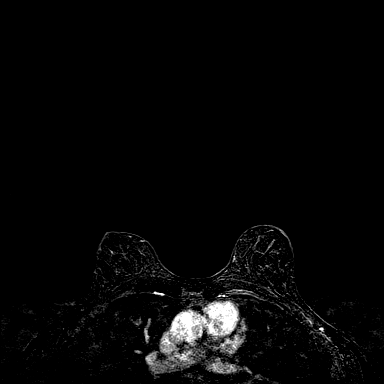
[im 100/144]
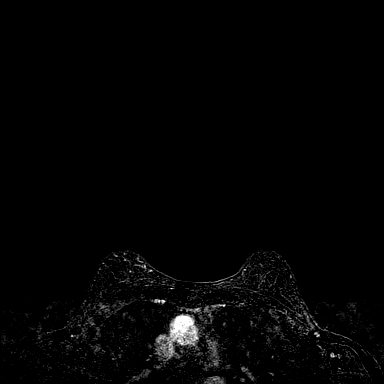
[im 122/144]
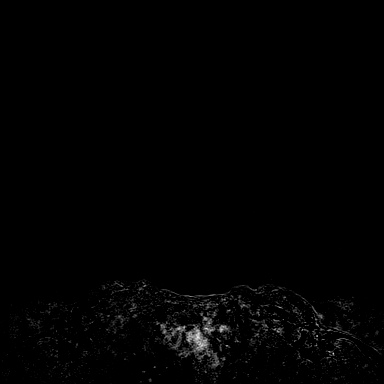
[im 144/144]
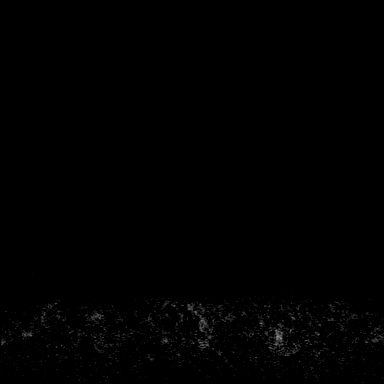

[Series 6: fl3d post-cm 20 · axial · 172.8mm · 0.91mm/px · 1 of 1 slices shown (3 of 3)]
[im 1/1]
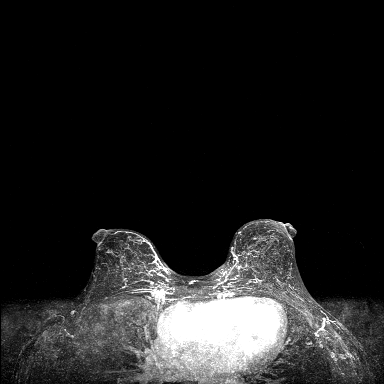

[30 of 48 positions shown; findings below may reference images not displayed]

Three-dimensional MR images were rendered by post-processing of the
original MR data on an independent workstation. The
three-dimensional MR images were interpreted, and findings are
reported in the following complete MRI report for this study. Three
dimensional images were evaluated at the independent [employer]
workstation
FINDINGS: Breast composition: c. Heterogeneous fibroglandular tissue.

Background parenchymal enhancement: Mild.

Right breast: No suspicious mass or abnormal enhancement.

Left breast: No suspicious mass or abnormal enhancement.

Lymph nodes: No pathologic lymphadenopathy.

Ancillary findings:  None.
IMPRESSION: No MRI evidence of malignancy involving either breast.

RECOMMENDATION:
Recommend annual screening mammography which is due in late [REDACTED] or
[DATE]. The patient is also eligible for annual Abbreviated
Breast MRI if she wishes.

BI-RADS CATEGORY  1: Negative.

## 2020-11-07 MED ORDER — GADOBUTROL 1 MMOL/ML IV SOLN
7.0000 mL | Freq: Once | INTRAVENOUS | Status: AC | PRN
  Administered 2020-11-07: 7 mL via INTRAVENOUS

## 2021-03-08 ENCOUNTER — Encounter: Payer: Self-pay | Admitting: Neurology

## 2021-03-08 ENCOUNTER — Ambulatory Visit: Payer: Managed Care, Other (non HMO) | Admitting: Neurology

## 2021-03-08 VITALS — BP 129/86 | HR 83 | Ht 63.0 in | Wt 127.3 lb

## 2021-03-08 DIAGNOSIS — G4763 Sleep related bruxism: Secondary | ICD-10-CM | POA: Diagnosis not present

## 2021-03-08 DIAGNOSIS — M26622 Arthralgia of left temporomandibular joint: Secondary | ICD-10-CM | POA: Diagnosis not present

## 2021-03-08 DIAGNOSIS — G4700 Insomnia, unspecified: Secondary | ICD-10-CM | POA: Diagnosis not present

## 2021-03-08 DIAGNOSIS — R0683 Snoring: Secondary | ICD-10-CM | POA: Diagnosis not present

## 2021-03-08 DIAGNOSIS — G478 Other sleep disorders: Secondary | ICD-10-CM

## 2021-03-08 NOTE — Progress Notes (Signed)
Subjective:    Patient ID: Morgan Acevedo is a 60 y.o. female.  HPI     Morgan Age, MD, PhD [facility] [address], [address] [place], [place] [address]  Dear Dr. Ardeth Perfect,   I saw your patient, Morgan Acevedo, upon your kind request, in my sleep clinic today for initial consultation of her sleep disorder, in particular, her snoring, difficulty initiating and maintaining sleep as well as nonrestorative sleep.  The patient is unaccompanied today.  As you know, Morgan Acevedo is a 60 year old right-handed woman with an underlying benign medical history, who reports recent difficulty in the past few years of difficulty initiating and maintaining sleep.  She has been told that she snores, she and her husband typically sleep in different bedrooms because of their individual sleep issues.  She is a restless sleeper, she has had some perimenopausal symptoms including hot flashes which have disrupted her sleep.  She has recently talked to an orthodontist about her malalignment and consideration for invisalign retainer.  She has been using a dentist made occlusive guard for the past year and a half which has helped with her clenching.  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  Her Epworth sleepiness score is 6 out of 24, fatigue severity score is 11 out of 63. I  reviewed your office note from 09/02/2020.  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  She denies any telltale symptoms of restless leg syndrome.  She goes to bed generally around 11 and rise time is generally between 6:30 AM and 7 AM.  She does not have night to night nocturia and denies any recurrent morning headaches, no family history of sleep apnea.  She has grown children, they have grandchildren as well.  She currently has no pets, she is a non-smoker and drinks alcohol in the form of wine, typically 1 with dinner, several days a week.  She drinks  caffeine in the form of coffee, 1 cup/day on average.  She was noted to have a narrow airway by her orthodontist.  She is somewhat restless at night because of pain particularly in her right shoulder.  Her hot flashes have improved since she started hormone replacement.  Her Past Medical History Is Significant For: History reviewed. No pertinent past medical history.  Her Past Surgical History Is Significant For: Past Surgical History:  Procedure Laterality Date  . TUBAL LIGATION      Her Family History Is Significant For: Family History  Problem Relation Acevedo of Onset  . Breast cancer Mother     Her Social History Is Significant For: Social History   Socioeconomic History  . Marital status: Married    Spouse name: Not on file  . Number of children: Not on file  . Years of education: Not on file  . Highest education level: Not on file  Occupational History  . Not on file  Tobacco Use  . Smoking status: Never Smoker  . Smokeless tobacco: Never Used  Vaping Use  . Vaping Use: Never used  Substance and Sexual Activity  . Alcohol use: Yes    Comment: Several servings a week  . Drug use: Never  . Sexual activity: Not on file  Other Topics Concern  . Not on file  Social History Narrative  . Not on file   Social Determinants of Health   Financial Resource Strain: Not on file  Food Insecurity:  Not on file  Transportation Needs: Not on file  Physical Activity: Not on file  Stress: Not on file  Social Connections: Not on file    Her Allergies Are:  No Known Allergies:   Her Current Medications Are:  Outpatient Encounter Medications as of 03/08/2021  Medication Sig  . Estradiol (DIVIGEL) 0.25 MG/0.25GM GEL Divigel 0.75 mg/0.75 gram (0.1%) transdermal gel packet  . progesterone (PROMETRIUM) 100 MG capsule Take 100 mg by mouth at bedtime.   No facility-administered encounter medications on file as of 03/08/2021.  :   Review of Systems:  Out of a complete 14 point  review of systems, all are reviewed and negative with the exception of these symptoms as listed below:   Review of Systems  Neurological:       Here for sleep consult. No prior sleep study. Snoring is present. Reports she has been told she has a narrow airway. She also reports she does not sleep well. She reports using a oral appliance over the last year 1/2 for teeth clenching.   Epworth Sleepiness Scale 0= would never doze 1= slight chance of dozing 2= moderate chance of dozing 3= high chance of dozing  Sitting and reading:1 Watching TV:1 Sitting inactive in a public place (ex. Theater or meeting):1 As a passenger in a car for an hour without a break:2 Lying down to rest in the afternoon:1 Sitting and talking to someone:0 Sitting quietly after lunch (no alcohol):0 In a car, while stopped in traffic:0 Total:6     Objective:  Neurological Exam  Physical Exam Physical Examination:   Vitals:   03/08/21 1102  BP: 129/86  Pulse: 83    General Examination: The patient is a very pleasant 59 y.o. female in no acute distress. She appears well-developed and well-nourished and well groomed.   HEENT: Normocephalic, atraumatic, pupils are equal, round and reactive to light, extraocular tracking is good without limitation to gaze excursion or nystagmus noted. Hearing is grossly intact. Face is symmetric with normal facial animation. Speech is clear with no dysarthria noted. There is no hypophonia. There is no lip, neck/head, jaw or voice tremor. Neck is supple with full range of passive and active motion. There are no carotid bruits on auscultation. Oropharynx exam reveals: mild mouth dryness, good dental hygiene and mild airway crowding, due to a smaller airway, Mallampati is class I, tonsils on the smaller side, neck circumference of 12 7/8 inches.  She has a mild to moderate overbite.  Tongue protrudes centrally and palate elevates symmetrically.  Chest: Clear to auscultation without  wheezing, rhonchi or crackles noted.  Heart: S1+S2+0, regular and normal without murmurs, rubs or gallops noted.   Abdomen: Soft, non-tender and non-distended.  Extremities: There is no obvious edema in the distal lower extremities bilaterally.   Skin: Warm and dry without trophic changes noted.   Musculoskeletal: exam reveals no obvious joint deformities, tenderness or joint swelling or erythema.   Neurologically:  Mental status: The patient is awake, alert and oriented in all 4 spheres. Her immediate and remote memory, attention, language skills and fund of knowledge are appropriate. There is no evidence of aphasia, agnosia, apraxia or anomia. Speech is clear with normal prosody and enunciation. Thought process is linear. Mood is normal and affect is normal.  Cranial nerves II - XII are as described above under HEENT exam.  Motor exam: Normal bulk, strength and tone is noted. There is no tremor, Romberg is negative. Fine motor skills and coordination: grossly intact.  Cerebellar testing: No dysmetria or intention tremor. There is no truncal or gait ataxia.  Sensory exam: intact to light touch in the upper and lower extremities.  Gait, station and balance: She stands easily. No veering to one side is noted. No leaning to one side is noted. Posture is Acevedo-appropriate and stance is narrow based. Gait shows normal stride length and normal pace. No problems turning are noted. Tandem walk is unremarkable.                Assessment and Plan:   In summary, Morgan Acevedo is a very pleasant 60 y.o.-year old female with a benign medical history, who presents for evaluation of her sleep disturbance.  She may be at risk for underlying sleep disordered breathing given her history of snoring and also smaller airway.   I had a long chat with the patient about my findings and the diagnosis of OSA, its prognosis and treatment options. We talked about medical treatments, surgical interventions and  non-pharmacological approaches. I explained in particular the risks and ramifications of untreated moderate to severe OSA, especially with respect to developing cardiovascular disease down the Road, including congestive heart failure, difficult to treat hypertension, cardiac arrhythmias, or stroke. Even type 2 diabetes has, in part, been linked to untreated OSA. Symptoms of untreated OSA include daytime sleepiness, memory problems, mood irritability and mood disorder such as depression and anxiety, lack of energy, as well as recurrent headaches, especially morning headaches. We talked about trying to maintain a healthy lifestyle in general, as well as the importance of weight control. I recommended the following at this time: sleep study.   I explained the sleep test procedure to the patient and also outlined possible surgical and non-surgical treatment options of OSA, including the use of a custom-made dental device (which would require a referral to a specialist dentist or oral surgeon), upper airway surgical options, such as traditional UPPP or a novel less invasive surgical option in the form of Inspire hypoglossal nerve stimulation (which would involve a referral to an ENT surgeon). I also explained the CPAP treatment option to the patient, who indicated that she would be willing to try CPAP if the need arises. I explained the importance of being compliant with PAP treatment, not only for insurance purposes but primarily to improve Her symptoms, and for the patient's long term health benefit, including to reduce Her cardiovascular risks. I answered all her questions today and the patient was in agreement. I plan to see her back after the sleep study is completed and encouraged her to call with any interim questions, concerns, problems or updates.   Thank you very much for allowing me to participate in the care of this nice patient. If I can be of any further assistance to you please do not hesitate to call  me at [phone].  Sincerely,   Morgan Age, MD, PhD

## 2021-03-08 NOTE — Patient Instructions (Signed)

## 2021-03-22 ENCOUNTER — Ambulatory Visit (INDEPENDENT_AMBULATORY_CARE_PROVIDER_SITE_OTHER): Payer: Managed Care, Other (non HMO) | Admitting: Neurology

## 2021-03-22 DIAGNOSIS — M26622 Arthralgia of left temporomandibular joint: Secondary | ICD-10-CM

## 2021-03-22 DIAGNOSIS — G4733 Obstructive sleep apnea (adult) (pediatric): Secondary | ICD-10-CM | POA: Diagnosis not present

## 2021-03-22 DIAGNOSIS — R0683 Snoring: Secondary | ICD-10-CM

## 2021-03-22 DIAGNOSIS — G478 Other sleep disorders: Secondary | ICD-10-CM

## 2021-03-22 DIAGNOSIS — G4700 Insomnia, unspecified: Secondary | ICD-10-CM

## 2021-03-22 DIAGNOSIS — G4763 Sleep related bruxism: Secondary | ICD-10-CM

## 2021-03-30 ENCOUNTER — Telehealth: Payer: Self-pay

## 2021-03-30 NOTE — Procedures (Signed)
   [facility] ([facility])  STUDY DATE: 03/22/21  DOB: 12-03-1961  MRN: [id number]  ORDERING CLINICIAN: Star Age, MD, PhD   REFERRING CLINICIAN: Velna Hatchet, MD   CLINICAL INFORMATION/HISTORY: 60 year old woman who reports snoring and difficulty in the past few years of difficulty initiating and maintaining sleep.   Epworth sleepiness score: 6/24.  BMI: 22.7 kg/m  Neck Circumference: 12-7/8"  FINDINGS:   Total Record Time (hours, min): 8 H 22 min  Total Sleep Time (hours, min):  7 H 46 min   Percent REM (%):    2/8.64 %   Calculated pAHI (per hour): 0.9      REM pAHI: 1.8    NREM pAHI: 0.6 Supine AHI: 0   Oxygen Saturation (%) Mean: 95  Minimum oxygen saturation (%):        90   O2 Saturation Range (%): 90-99  O2Saturation (minutes) <=88%: 0 min  Pulse Mean (bpm):    59  Pulse Range (44-96)   IMPRESSION: Primary snoring    RECOMMENDATION:  This home sleep test does not demonstrate any significant obstructive or central sleep disordered breathing. Some snoring was noted, and appeared to be intermittent, in the mild range. Other causes of the patient's symptoms, including circadian rhythm disturbances, an underlying mood disorder, medication effect and/or an underlying medical problem cannot be ruled out based on this test. Clinical correlation is recommended. The patient should be cautioned not to drive, work at heights, or operate dangerous or heavy equipment when tired or sleepy. Review and reiteration of good sleep hygiene measures should be pursued with any patient. The patient can follow up with her referring provider, who will be notified of the test results.  I certify that I have reviewed the raw data recording prior to the issuance of this report in accordance with the standards of the American Academy of Sleep Medicine (AASM).  INTERPRETING PHYSICIAN:  Star Age, MD, PhD  Board Certified in Neurology and Sleep  Medicine  [facility] [address] [place], [place] [address] [phone]

## 2021-03-30 NOTE — Telephone Encounter (Signed)
I called pt. No answer, left a message asking pt to call me back.   

## 2021-03-30 NOTE — Telephone Encounter (Signed)
-----   Message from Star Age, MD sent at 03/30/2021 12:03 PM EDT ----- Patient referred by Dr. Ardeth Perfect, seen by me on 02/2821, HST on 03/22/21.   Please call and notify the patient that the recent home sleep test did not show any significant obstructive sleep apnea.  Mild, intermittent snoring was noted.  Patient can follow up with the referring provider, as well as her orthodontist for her TMJ, as scheduled.    Thanks,  Star Age, MD, PhD [facility])

## 2021-03-30 NOTE — Progress Notes (Signed)
Patient referred by Dr. Ardeth Perfect, seen by me on 02/2821, HST on 03/22/21.   Please call and notify the patient that the recent home sleep test did not show any significant obstructive sleep apnea.  Mild, intermittent snoring was noted.  Patient can follow up with the referring provider, as well as her orthodontist for her TMJ, as scheduled.    Thanks,  Star Age, MD, PhD [facility])

## 2021-04-01 NOTE — Telephone Encounter (Signed)
I called pt. No answer, left a message asking pt to call me back.   

## 2021-04-06 NOTE — Telephone Encounter (Addendum)
I called the pt back and left vm ( ok per dpr). I attempted to call the pt on 4/19/, 4/21, and 4/26 and no luck.  Pt advised of the results and advised to cb if she had any questions.  Results have been fwd to Dr. Ardeth Perfect.

## 2021-04-14 NOTE — Telephone Encounter (Signed)
Pt called back and we discussed results. She verbalized understanding.

## 2021-07-16 ENCOUNTER — Other Ambulatory Visit: Payer: Self-pay | Admitting: Obstetrics and Gynecology

## 2021-07-16 DIAGNOSIS — Z803 Family history of malignant neoplasm of breast: Secondary | ICD-10-CM

## 2021-12-17 ENCOUNTER — Ambulatory Visit
Admission: RE | Admit: 2021-12-17 | Discharge: 2021-12-17 | Disposition: A | Discharge status: Home / Self Care | Payer: Managed Care, Other (non HMO) | Source: Ambulatory Visit | Attending: Obstetrics and Gynecology | Admitting: Obstetrics and Gynecology

## 2021-12-17 DIAGNOSIS — Z803 Family history of malignant neoplasm of breast: Secondary | ICD-10-CM

## 2021-12-17 IMAGING — MR MR BREAST WO/W CM  BILAT
8 of 12 series · 33 of 48 positions shown · IV contrast (6ml Gadavist)
Comparison: [DATE] MRI and [DATE] screening mammogram

CLINICAL DATA: Family history of breast cancer. Currently
asymptomatic.

EXAM:
BILATERAL BREAST MRI WITH AND WITHOUT CONTRAST
TECHNIQUE: Multiplanar, multisequence MR images of both breasts were obtained
prior to and following the intravenous administration of 6 ml of
Gadavist

[Series 2: t2_tirm_tra ipat (a-p) · axial · 3.0mm · 0.66mm/px · 1 of 55 slices shown]
[im 1/55]
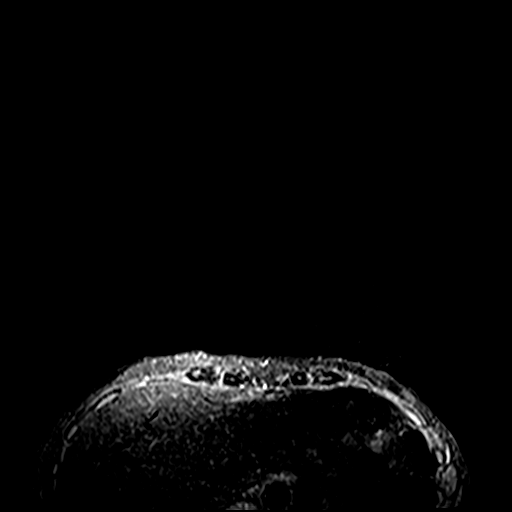

[Series 3: fl3d pre-cm no · axial · non-contrast · 1.2mm · 0.89mm/px · z∈[-90,+63]mm · 5 of 128 slices shown]
[im 1/128]
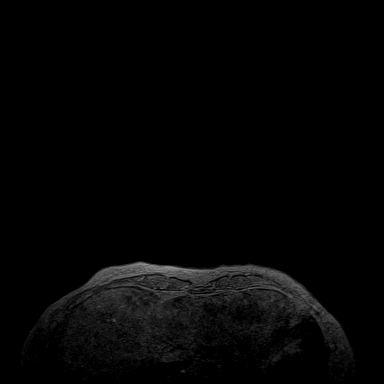
[im 32/128]
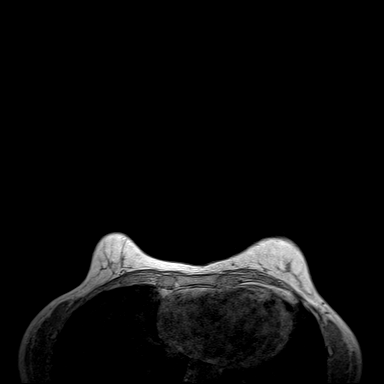
[im 64/128]
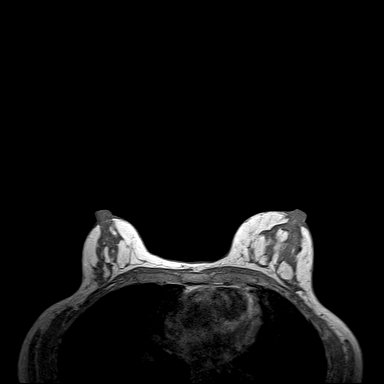
[im 96/128]
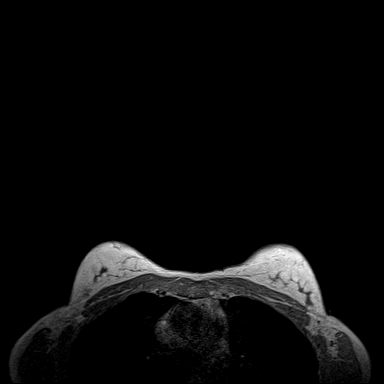
[im 128/128]
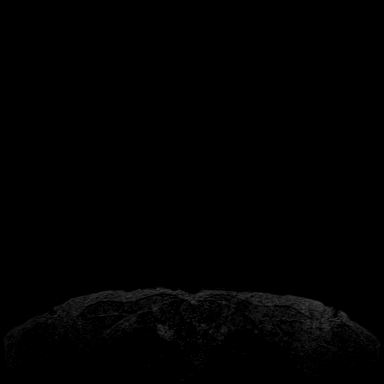

[Series 4: fl3d pre-cm · axial · non-contrast · 1.2mm · 0.89mm/px · z∈[-90,+63]mm · 5 of 128 slices shown]
[im 1/128]
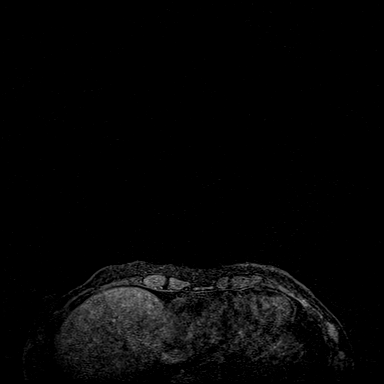
[im 32/128]
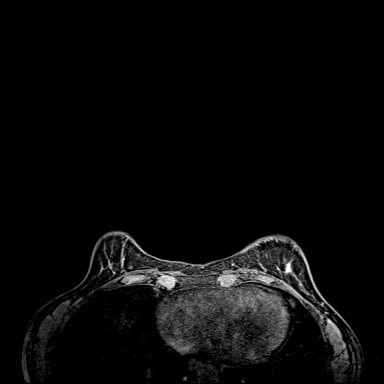
[im 64/128]
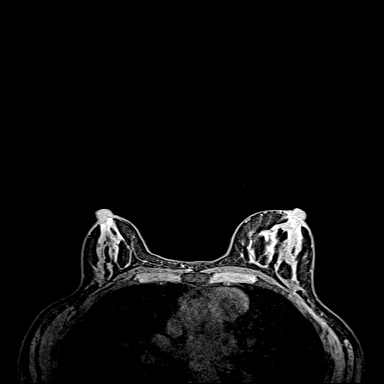
[im 96/128]
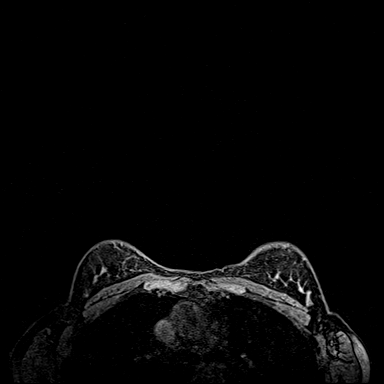
[im 128/128]
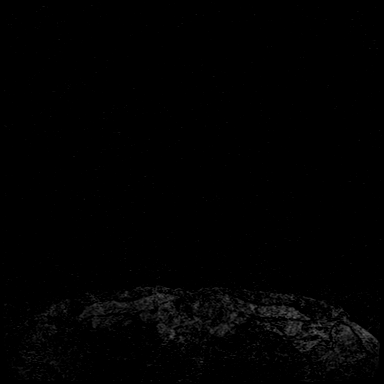

[Series 5: fl3d post immediate · axial · 1.2mm · 0.89mm/px · z∈[-90,+63]mm · 5 of 128 slices shown (1 of 3)]
[im 1/128]
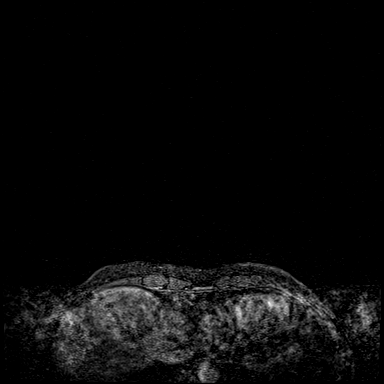
[im 32/128]
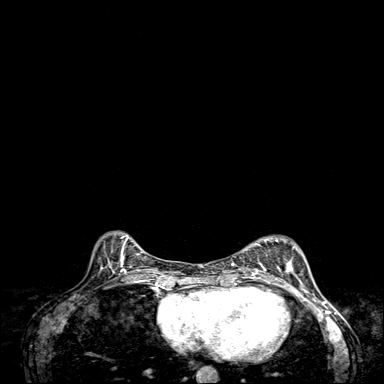
[im 64/128]
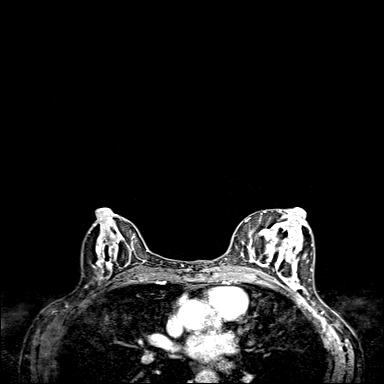
[im 96/128]
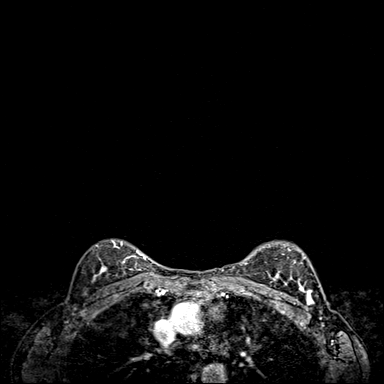
[im 128/128]
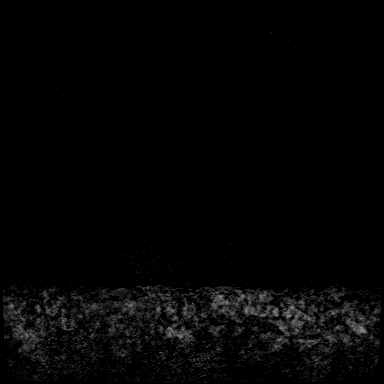

[Series 6: fl3d post immediate · axial · 1.2mm · 0.89mm/px · z∈[-90,+63]mm · 5 of 128 slices shown (2 of 3)]
[im 1/128]
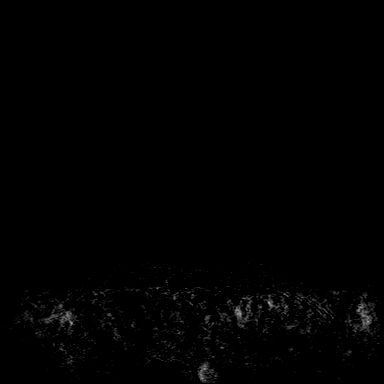
[im 32/128]
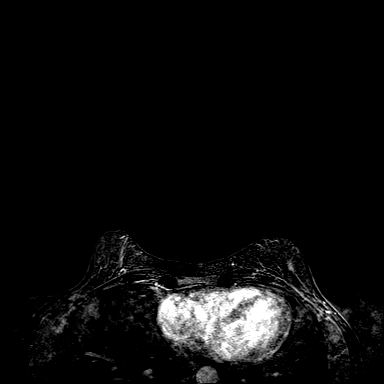
[im 64/128]
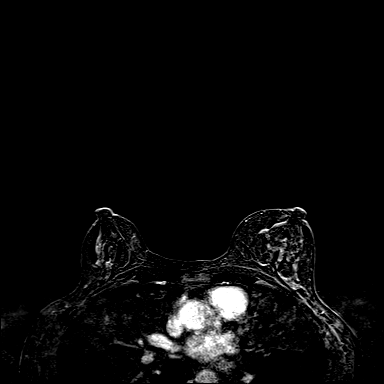
[im 96/128]
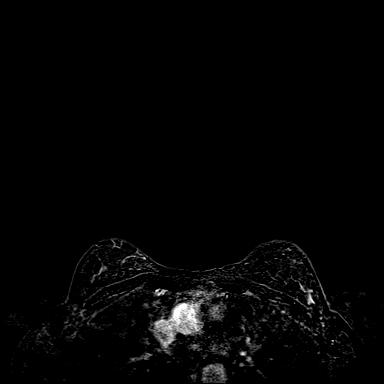
[im 128/128]
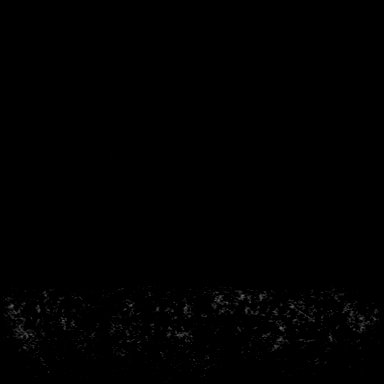

[Series 7: fl3d post immediate · axial · 153.6mm · 0.89mm/px · 1 of 1 slices shown (3 of 3)]
[im 1/1]
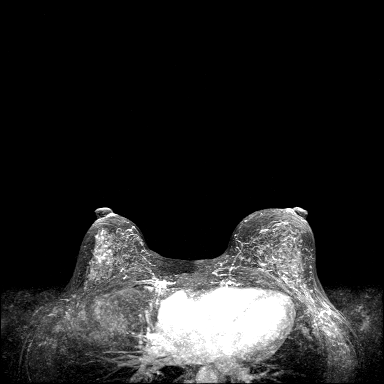

[Series 8: fl3d post 3min · axial · 1.2mm · 0.89mm/px · z∈[-90,+63]mm · 6 of 128 slices shown]
[im 1/128]
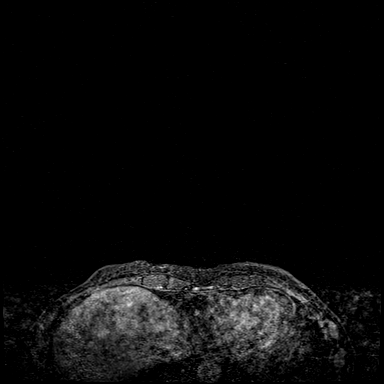
[im 26/128]
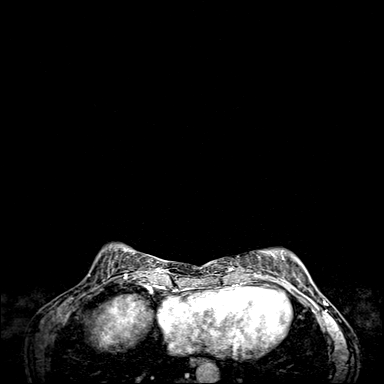
[im 51/128]
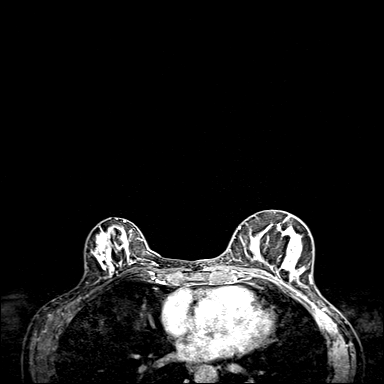
[im 77/128]
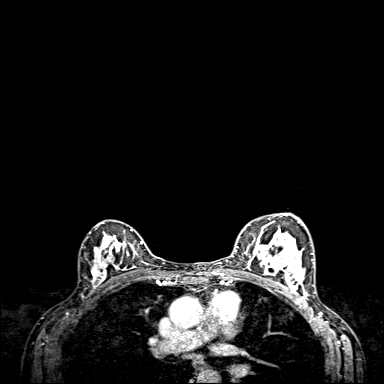
[im 102/128]
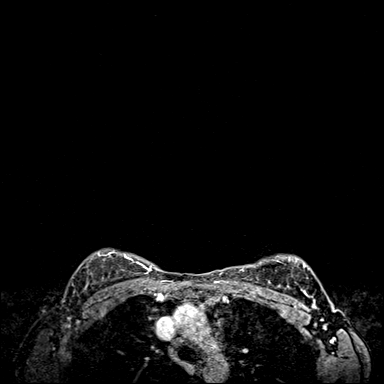
[im 128/128]
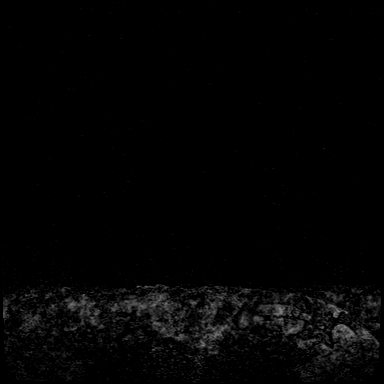

[Series 9: fl3d post 3min_sub · axial · 1.2mm · 0.89mm/px · z∈[-90,+31]mm · 5 of 128 slices shown]
[im 1/128]
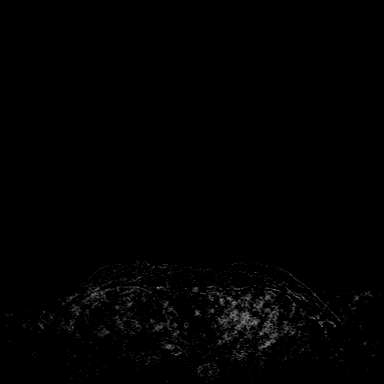
[im 26/128]
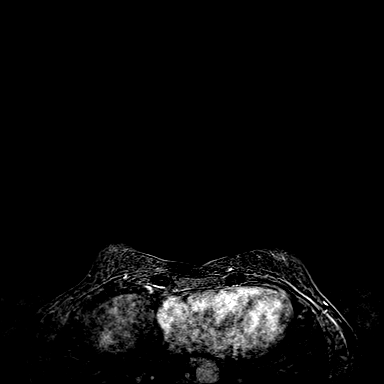
[im 51/128]
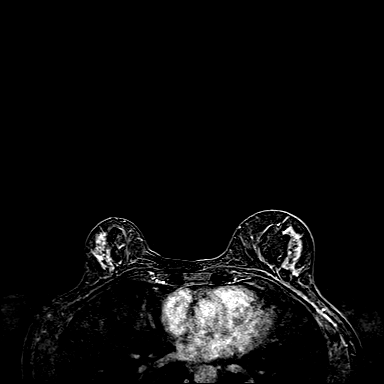
[im 77/128]
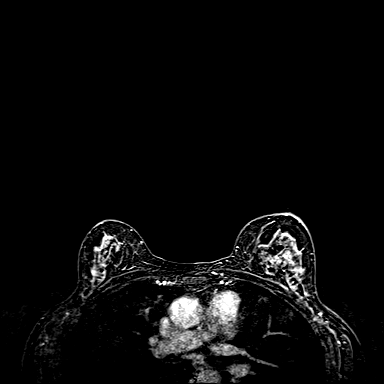
[im 102/128]
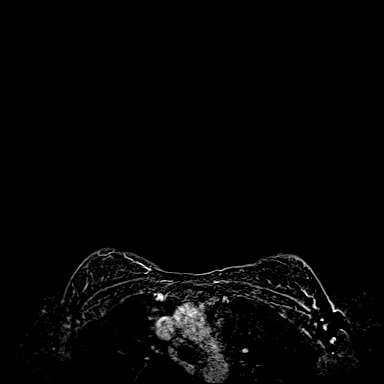

[33 of 48 positions shown; findings below may reference images not displayed]

Three-dimensional MR images were rendered by post-processing of the
original MR data on an independent workstation. The
three-dimensional MR images were interpreted, and findings are
reported in the following complete MRI report for this study. Three
dimensional images were evaluated at the independent interpreting
workstation using the [employer]aCAD thin client.
FINDINGS: Breast composition: c. Heterogeneous fibroglandular tissue.

Background parenchymal enhancement: Moderate

Right breast: No mass or abnormal enhancement.

Left breast: No mass or abnormal enhancement.

Lymph nodes: No abnormal appearing lymph nodes.

Ancillary findings:  None.
IMPRESSION: No MRI evidence for malignancy in either breast.

RECOMMENDATION:
Recommend screening mammogram in [DATE].

Based on the recommendations of the [employer], annual
screening MRI is suggested in addition to annual mammography if the
patient has an estimated lifetime risk of developing breast cancer
which is greater than 20%.

BI-RADS CATEGORY  1: Negative.

## 2021-12-17 MED ORDER — GADOBUTROL 1 MMOL/ML IV SOLN
6.0000 mL | Freq: Once | INTRAVENOUS | Status: AC | PRN
  Administered 2021-12-17: 6 mL via INTRAVENOUS

## 2022-07-12 ENCOUNTER — Other Ambulatory Visit: Payer: Self-pay | Admitting: Radiology

## 2022-07-12 NOTE — Telephone Encounter (Signed)
Patient not seen at this practice. Note on denial asking patient to call office.

## 2022-08-22 ENCOUNTER — Other Ambulatory Visit: Payer: Self-pay | Admitting: Radiology

## 2022-08-22 NOTE — Telephone Encounter (Signed)
Rx received for Estradiol.  Patient not established at [facility]. No future appts.  Rx refused.  Routing to Dr. Ileene Musa   Encounter closed.

## 2022-09-30 ENCOUNTER — Other Ambulatory Visit: Payer: Self-pay | Admitting: Internal Medicine

## 2022-09-30 DIAGNOSIS — E785 Hyperlipidemia, unspecified: Secondary | ICD-10-CM

## 2022-10-05 ENCOUNTER — Other Ambulatory Visit: Payer: Self-pay | Admitting: Internal Medicine

## 2022-10-05 DIAGNOSIS — Z1231 Encounter for screening mammogram for malignant neoplasm of breast: Secondary | ICD-10-CM

## 2022-11-25 ENCOUNTER — Ambulatory Visit
Admission: RE | Admit: 2022-11-25 | Discharge: 2022-11-25 | Disposition: A | Discharge status: Home / Self Care | Payer: Managed Care, Other (non HMO) | Source: Ambulatory Visit | Attending: Internal Medicine | Admitting: Internal Medicine

## 2022-11-25 DIAGNOSIS — Z1231 Encounter for screening mammogram for malignant neoplasm of breast: Secondary | ICD-10-CM

## 2022-12-20 ENCOUNTER — Ambulatory Visit
Admission: RE | Admit: 2022-12-20 | Discharge: 2022-12-20 | Disposition: A | Discharge status: Home / Self Care | Payer: No Typology Code available for payment source | Source: Ambulatory Visit | Attending: Internal Medicine | Admitting: Internal Medicine

## 2022-12-20 DIAGNOSIS — E785 Hyperlipidemia, unspecified: Secondary | ICD-10-CM

## 2023-06-29 ENCOUNTER — Other Ambulatory Visit: Payer: Self-pay | Admitting: Family

## 2023-06-29 DIAGNOSIS — Z803 Family history of malignant neoplasm of breast: Secondary | ICD-10-CM

## 2023-09-13 ENCOUNTER — Other Ambulatory Visit: Payer: Managed Care, Other (non HMO)

## 2023-10-16 ENCOUNTER — Ambulatory Visit
Admission: RE | Admit: 2023-10-16 | Discharge: 2023-10-16 | Disposition: A | Discharge status: Home / Self Care | Payer: Managed Care, Other (non HMO) | Source: Ambulatory Visit | Attending: Family | Admitting: Family

## 2023-10-16 DIAGNOSIS — Z803 Family history of malignant neoplasm of breast: Secondary | ICD-10-CM

## 2023-10-16 MED ORDER — GADOPICLENOL 0.5 MMOL/ML IV SOLN
5.0000 mL | Freq: Once | INTRAVENOUS | Status: AC | PRN
  Administered 2023-10-16: 5 mL via INTRAVENOUS

## 2024-07-01 ENCOUNTER — Other Ambulatory Visit: Payer: Self-pay | Admitting: Obstetrics and Gynecology

## 2024-07-01 DIAGNOSIS — Z1231 Encounter for screening mammogram for malignant neoplasm of breast: Secondary | ICD-10-CM

## 2024-07-03 ENCOUNTER — Other Ambulatory Visit: Payer: Self-pay | Admitting: Family

## 2024-07-03 DIAGNOSIS — Z803 Family history of malignant neoplasm of breast: Secondary | ICD-10-CM

## 2024-07-23 ENCOUNTER — Ambulatory Visit
Admission: RE | Admit: 2024-07-23 | Discharge: 2024-07-23 | Disposition: A | Discharge status: Home / Self Care | Source: Ambulatory Visit | Attending: Obstetrics and Gynecology | Admitting: Obstetrics and Gynecology

## 2024-07-23 DIAGNOSIS — Z1231 Encounter for screening mammogram for malignant neoplasm of breast: Secondary | ICD-10-CM

## 2024-12-10 ENCOUNTER — Other Ambulatory Visit

## 2025-01-08 ENCOUNTER — Ambulatory Visit
Admission: RE | Admit: 2025-01-08 | Discharge: 2025-01-08 | Disposition: A | Source: Ambulatory Visit | Attending: Family | Admitting: Family

## 2025-01-08 DIAGNOSIS — Z803 Family history of malignant neoplasm of breast: Secondary | ICD-10-CM

## 2025-01-08 MED ORDER — GADOPICLENOL 0.5 MMOL/ML IV SOLN
6.0000 mL | Freq: Once | INTRAVENOUS | Status: AC | PRN
  Administered 2025-01-08: 6 mL via INTRAVENOUS
